# Patient Record
Sex: Male | Born: 1980
Health system: Southern US, Community
[De-identification: ages and names within clinical notes are randomized; demographics above are authoritative.]

## PROBLEM LIST (undated history)

## (undated) DIAGNOSIS — I8390 Asymptomatic varicose veins of unspecified lower extremity: Secondary | ICD-10-CM

## (undated) DIAGNOSIS — Z973 Presence of spectacles and contact lenses: Secondary | ICD-10-CM

## (undated) DIAGNOSIS — T7840XA Allergy, unspecified, initial encounter: Secondary | ICD-10-CM

## (undated) HISTORY — DX: Presence of spectacles and contact lenses: Z97.3

## (undated) HISTORY — DX: Asymptomatic varicose veins of unspecified lower extremity: I83.90

## (undated) HISTORY — DX: Allergy, unspecified, initial encounter: T78.40XA

## (undated) HISTORY — PX: ARTHROSCOPY KNEE W/ DRILLING: SUR92

---

## 2009-07-07 ENCOUNTER — Ambulatory Visit: Payer: Self-pay | Admitting: Family Medicine

## 2009-07-28 ENCOUNTER — Ambulatory Visit: Payer: Self-pay | Admitting: Family Medicine

## 2011-07-16 ENCOUNTER — Encounter: Payer: Self-pay | Admitting: Family Medicine

## 2011-07-17 ENCOUNTER — Encounter: Payer: Self-pay | Admitting: Medical

## 2011-07-17 ENCOUNTER — Ambulatory Visit (INDEPENDENT_AMBULATORY_CARE_PROVIDER_SITE_OTHER): Payer: BC Managed Care – PPO | Admitting: Medical

## 2011-07-17 VITALS — BP 120/80 | HR 72 | Temp 98.0°F | Resp 16 | Ht 70.5 in | Wt 212.0 lb

## 2011-07-17 DIAGNOSIS — D492 Neoplasm of unspecified behavior of bone, soft tissue, and skin: Secondary | ICD-10-CM

## 2011-07-17 DIAGNOSIS — Z0000 Encounter for general adult medical examination without abnormal findings: Secondary | ICD-10-CM

## 2011-07-17 DIAGNOSIS — R200 Anesthesia of skin: Secondary | ICD-10-CM | POA: Insufficient documentation

## 2011-07-17 DIAGNOSIS — B372 Candidiasis of skin and nail: Secondary | ICD-10-CM

## 2011-07-17 DIAGNOSIS — A6000 Herpesviral infection of urogenital system, unspecified: Secondary | ICD-10-CM | POA: Insufficient documentation

## 2011-07-17 DIAGNOSIS — R209 Unspecified disturbances of skin sensation: Secondary | ICD-10-CM

## 2011-07-17 LAB — POCT URINALYSIS DIPSTICK
Bilirubin, UA: NEGATIVE
Blood, UA: NEGATIVE
Glucose, UA: NEGATIVE
Ketones, UA: NEGATIVE
Leukocytes, UA: NEGATIVE
Nitrite, UA: NEGATIVE
Protein, UA: NEGATIVE
Spec Grav, UA: 1.02
Urobilinogen, UA: NEGATIVE
pH, UA: 5

## 2011-07-17 LAB — CBC WITH DIFFERENTIAL/PLATELET
Basophils Absolute: 0 10*3/uL (ref 0.0–0.1)
Basophils Relative: 1 % (ref 0–1)
Eosinophils Absolute: 0.1 10*3/uL (ref 0.0–0.7)
Eosinophils Relative: 3 % (ref 0–5)
HCT: 44.9 % (ref 39.0–52.0)
Hemoglobin: 15.6 g/dL (ref 13.0–17.0)
Lymphocytes Relative: 52 % — ABNORMAL HIGH (ref 12–46)
Lymphs Abs: 2.2 10*3/uL (ref 0.7–4.0)
MCH: 28.9 pg (ref 26.0–34.0)
MCHC: 34.7 g/dL (ref 30.0–36.0)
MCV: 83.3 fL (ref 78.0–100.0)
Monocytes Absolute: 0.3 10*3/uL (ref 0.1–1.0)
Monocytes Relative: 7 % (ref 3–12)
Neutro Abs: 1.5 10*3/uL — ABNORMAL LOW (ref 1.7–7.7)
Neutrophils Relative %: 37 % — ABNORMAL LOW (ref 43–77)
Platelets: 225 10*3/uL (ref 150–400)
RBC: 5.39 MIL/uL (ref 4.22–5.81)
RDW: 12.8 % (ref 11.5–15.5)
WBC: 4.2 10*3/uL (ref 4.0–10.5)

## 2011-07-17 LAB — COMPREHENSIVE METABOLIC PANEL WITH GFR
ALT: 26 U/L (ref 0–53)
AST: 16 U/L (ref 0–37)
Albumin: 5 g/dL (ref 3.5–5.2)
Alkaline Phosphatase: 57 U/L (ref 39–117)
BUN: 16 mg/dL (ref 6–23)
CO2: 29 meq/L (ref 19–32)
Calcium: 10 mg/dL (ref 8.4–10.5)
Chloride: 102 meq/L (ref 96–112)
Creat: 1.27 mg/dL (ref 0.50–1.35)
Glucose, Bld: 84 mg/dL (ref 70–99)
Potassium: 4.5 meq/L (ref 3.5–5.3)
Sodium: 139 meq/L (ref 135–145)
Total Bilirubin: 0.6 mg/dL (ref 0.3–1.2)
Total Protein: 7.9 g/dL (ref 6.0–8.3)

## 2011-07-17 LAB — LIPID PANEL
Cholesterol: 134 mg/dL (ref 0–200)
HDL: 52 mg/dL (ref >39)
LDL Cholesterol: 74 mg/dL (ref 0–99)
Total CHOL/HDL Ratio: 2.6 ratio
Triglycerides: 40 mg/dL (ref <150)
VLDL: 8 mg/dL (ref 0–40)

## 2011-07-17 LAB — TSH: TSH: 1.128 u[IU]/mL (ref 0.350–4.500)

## 2011-07-17 MED ORDER — VALACYCLOVIR HCL 1 G PO TABS: ORAL_TABLET | ORAL | Status: DC

## 2011-07-17 MED ORDER — FLUCONAZOLE 150 MG PO TABS: ORAL_TABLET | ORAL | Status: DC

## 2011-07-17 NOTE — Progress Notes (Signed)
Subjective:   HPI  Adam Flowers is a 30 y.o. male who presents for a complete physical.  Last visit here about a year ago for physical.  Sees eye doctor yearly, just saw dentist recently for routine care.  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  Thinks tetanus booster is up to date, and declines flu shot today.  He has been in usual state of health, but has a few concerns today.    He has hx/o genital herpes, has had 3 outbreaks this year.  Was last taking generic Valtrex for acute therapy only, but thinks it didn't work as good as name brand Valtrex.  Needs refill.  Diagnosed last year.  He got married recently and was wearing shoes that must have been too tight.  Since then, the left great toe has felt numb at the tip.  It hasn't really gotten back to normal.  He notes that wife gets yeast infections frequently, and she is under treatment for this currently.  He thinks she may have given him the same given some mild discomfort at head of penis, occasional discomfort with urinary.   He denies hx/o other STDs, and declines other STD screening today.    Reviewed their medical, surgical, family, social, medication, and allergy history and updated chart as appropriate.   Past Medical History  Diagnosis Date  . Allergy   . Wears glasses   . Genital herpes     Past Surgical History  Procedure Date  . Arthroscopy knee w/ drilling     Family History  Problem Relation Age of Onset  . Diabetes Father   . Cancer Neg Hx   . Heart disease Neg Hx   . Hypertension Neg Hx   . Hyperlipidemia Neg Hx   . Stroke Neg Hx     History   Social History  . Marital Status: Single    Spouse Name: N/A    Number of Children: N/A  . Years of Education: N/A   Occupational History  . [employer], Firefighter    Social History Main Topics  . Smoking status: Never Smoker   . Smokeless tobacco: Never Used    . Alcohol Use: 2.5 oz/week    5 drink(s) per week  . Drug Use: No  . Sexually Active: Not on file   Other Topics Concern  . Not on file   Social History Narrative   PhD student in business administration, recently married 9/12, 2.5 yo son, wife pregnant now 10/12, exercise 1-2 days/wk, 3hrs at a time with basketball    No current outpatient prescriptions on file prior to visit.    No Known Allergies  Review of Systems Constitutional: -fever, -chills, -sweats, -unexpected weight change, -anorexia, -fatigue Allergy: -sneezing, -itching, -congestion Dermatology: denies changing moles, rash, lumps, new worrisome lesions ENT: -runny nose, -ear pain, -sore throat, -hoarseness, -sinus pain, -teeth pain, -tinnitus, -hearing loss, -epistaxis Cardiology:  -chest pain, -palpitations, -edema, -orthopnea, -paroxysmal nocturnal dyspnea Respiratory: -cough, -shortness of breath, -dyspnea on exertion, -wheezing, -hemoptysis Gastroenterology: -abdominal pain, -nausea, -vomiting, -diarrhea, -constipation, -blood in stool, -changes in bowel movement, -dysphagia Hematology: -bleeding or bruising problems Musculoskeletal: -arthralgias, -myalgias, -joint swelling, -back pain, -neck pain, -cramping, -gait changes Ophthalmology: -vision changes, -eye redness, -itching, -discharge Urology: -dysuria, -difficulty urinating, -hematuria, -urinary frequency, -urgency, incontinence Neurology: -headache, -weakness, +tingling, -numbness, -speech abnormality, -memory loss, -falls, -dizziness Psychology:  -depressed mood, -agitation, -sleep problems  Objective:   Physical Exam  Filed Vitals:   07/17/11 0955  BP: 120/80  Pulse: 72  Temp: 98 F (36.7 C)  Resp: 16    General appearance: alert, no distress, WD/WN, black male Skin:right lateral lower leg distally with 1.7 cm round raised verrucal lesion, otherwise skin unremarkable, no other worrisome lesions HEENT: normocephalic, conjunctiva/corneas  normal, sclerae anicteric, PERRLA, EOMi, nares patent, no discharge or erythema, pharynx normal Oral cavity: MMM, tongue normal, teeth in good repair Neck: supple, no lymphadenopathy, no thyromegaly, no masses, normal ROM, no bruits Chest: non tender, normal shape and expansion Heart: RRR, normal S1, S2, no murmurs Lungs: CTA bilaterally, no wheezes, rhonchi, or rales Abdomen: +bs, soft, non tender, non distended, no masses, no hepatomegaly, no splenomegaly, no bruits Back: non tender, normal ROM, no scoliosis Musculoskeletal: upper extremities non tender, no obvious deformity, normal ROM throughout, lower extremities non tender, no obvious deformity, normal ROM throughout Extremities: no edema, no cyanosis, no clubbing Pulses: 2+ symmetric, upper and lower extremities, normal cap refill Neurological: alert, oriented x 3, CN2-12 intact, strength normal upper extremities and lower extremities, sensation normal throughout, DTRs 2+ throughout, no cerebellar signs, gait normal Psychiatric: normal affect, behavior normal, pleasant  GU: normal male external genitalia, nontender, no masses, no hernia, no lymphadenopathy Rectal: deferred.   Assessment and Plan :    Encounter Diagnoses  Name Primary?  . Routine general medical examination at a health care facility Yes  . Genital herpes   . Numbness of toes   . Neoplasm of skin of lower leg   . Skin yeast infection     Physical exam - discussed healthy lifestyle, diet, exercise, preventative care, vaccinations, and addressed their concerns.  He will check college records for last Tdap.  Declines flu shot.  Labs today, fasting.  Genital herpes - discussed acute vs suppressive therapy.  Refills today, discussed means of transmission.   If outbreaks c/t to occur frequently, may consider suppressive therapy.  Numbness of toes - likely related to the recent tight shoes.  Will give this a little more time to resolve. Labs today.  If not resolved in  another month, he will let me know.    Neoplasm - right ankle laterally site of prior trauma, but appears verrucal now.  He will either return for punch biopsy, or we can refer to dermatology for complete excision.    Will cover for yeast infection with Diflucan.  Call/return if not worsening.   Follow-up pending labs.

## 2011-07-17 NOTE — Patient Instructions (Signed)
Preventative Care for Adults, Male       REGULAR HEALTH EXAMS:  A routine yearly physical is a good way to check in with your primary care provider about your health and preventive screening. It is also an opportunity to share updates about your health and any concerns you have, and receive a thorough all-over exam.   Most health insurance companies pay for at least some preventative services.  Check with your health plan for specific coverages.  WHAT PREVENTATIVE SERVICES DO MEN NEED?  Adult men should have their weight and blood pressure checked regularly.   Men age 35 and older should have their cholesterol levels checked regularly.  Beginning at age 50 and continuing to age 75, men should be screened for colorectal cancer.  Certain people should may need continued testing until age 85.  Other cancer screening may include exams for testicular and prostate cancer.  Updating vaccinations is part of preventative care.  Vaccinations help protect against diseases such as the flu.  Lab tests are generally done as part of preventative care to screen for anemia and blood disorders, to screen for problems with the kidneys and liver, to screen for bladder problems, to check blood sugar, and to check your cholesterol level.  Preventative services generally include counseling about diet, exercise, avoiding tobacco, drugs, excessive alcohol consumption, and sexually transmitted infections.    GENERAL RECOMMENDATIONS FOR GOOD HEALTH:  Healthy diet:  Eat a variety of foods, including fruit, vegetables, animal or vegetable protein, such as meat, fish, chicken, and eggs, or beans, lentils, tofu, and grains, such as rice.  Drink plenty of water daily.  Decrease saturated fat in the diet, avoid lots of red meat, processed foods, sweets, fast foods, and fried foods.  Exercise:  Aerobic exercise helps maintain good heart health. At least 30-40 minutes of moderate-intensity exercise is recommended.  For example, a brisk walk that increases your heart rate and breathing. This should be done on most days of the week.   Find a type of exercise or a variety of exercises that you enjoy so that it becomes a part of your daily life.  Examples are running, walking, swimming, water aerobics, and biking.  For motivation and support, explore group exercise such as aerobic class, spin class, Zumba, Yoga,or  martial arts, etc.    Set exercise goals for yourself, such as a certain weight goal, walk or run in a race such as a 5k walk/run.  Speak to your primary care provider about exercise goals.  Disease prevention:  If you smoke or chew tobacco, find out from your caregiver how to quit. It can literally save your life, no matter how long you have been a tobacco user. If you do not use tobacco, never begin.   Maintain a healthy diet and normal weight. Increased weight leads to problems with blood pressure and diabetes.   The Body Mass Index or BMI is a way of measuring how much of your body is fat. Having a BMI above 27 increases the risk of heart disease, diabetes, hypertension, stroke and other problems related to obesity. Your caregiver can help determine your BMI and based on it develop an exercise and dietary program to help you achieve or maintain this important measurement at a healthful level.  High blood pressure causes heart and blood vessel problems.  Persistent high blood pressure should be treated with medicine if weight loss and exercise do not work.   Fat and cholesterol leaves deposits in your arteries   that can block them. This causes heart disease and vessel disease elsewhere in your body.  If your cholesterol is found to be high, or if you have heart disease or certain other medical conditions, then you may need to have your cholesterol monitored frequently and be treated with medication.   Ask if you should have a stress test if your history suggests this. A stress test is a test done on  a treadmill that looks for heart disease. This test can find disease prior to there being a problem.  Avoid drinking alcohol in excess (more than two drinks per day).  Avoid use of street drugs. Do not share needles with anyone. Ask for professional help if you need assistance or instructions on stopping the use of alcohol, cigarettes, and/or drugs.  Brush your teeth twice a day with fluoride toothpaste, and floss once a day. Good oral hygiene prevents tooth decay and gum disease. The problems can be painful, unattractive, and can cause other health problems. Visit your dentist for a routine oral and dental check up and preventive care every 6-12 months.   Look at your skin regularly.  Use a mirror to look at your back. Notify your caregivers of changes in moles, especially if there are changes in shapes, colors, a size larger than a pencil eraser, an irregular border, or development of new moles.  Safety:  Use seatbelts 100% of the time, whether driving or as a passenger.  Use safety devices such as hearing protection if you work in environments with loud noise or significant background noise.  Use safety glasses when doing any work that could send debris in to the eyes.  Use a helmet if you ride a bike or motorcycle.  Use appropriate safety gear for contact sports.  Talk to your caregiver about gun safety.  Use sunscreen with a SPF (or skin protection factor) of 15 or greater.  Lighter skinned people are at a greater risk of skin cancer. Don't forget to also wear sunglasses in order to protect your eyes from too much damaging sunlight. Damaging sunlight can accelerate cataract formation.   Practice safe sex. Use condoms. Condoms are used for birth control and to help reduce the spread of sexually transmitted infections (or STIs).  Some of the STIs are gonorrhea (the clap), chlamydia, syphilis, trichomonas, herpes, HPV (human papilloma virus) and HIV (human immunodeficiency virus) which causes AIDS.  The herpes, HIV and HPV are viral illnesses that have no cure. These can result in disability, cancer and death.   Keep carbon monoxide and smoke detectors in your home functioning at all times. Change the batteries every 6 months or use a model that plugs into the wall.   Vaccinations:  Stay up to date with your tetanus shots and other required immunizations. You should have a booster for tetanus every 10 years. Be sure to get your flu shot every year, since 5%-20% of the [place] population comes down with the flu. The flu vaccine changes each year, so being vaccinated once is not enough. Get your shot in the fall, before the flu season peaks.   Other vaccines to consider:  Pneumococcal vaccine to protect against certain types of pneumonia.  This is normally recommended for adults age 65 or older.  However, adults younger than 29 years old with certain underlying conditions such as diabetes, heart or lung disease should also receive the vaccine.  Shingles vaccine to protect against Varicella Zoster if you are older than age 60, or younger   than 29 years old with certain underlying illness.  Hepatitis A vaccine to protect against a form of infection of the liver by a virus acquired from food.  Hepatitis B vaccine to protect against a form of infection of the liver by a virus acquired from blood or body fluids, particularly if you work in health care.  If you plan to travel internationally, check with your local health department for specific vaccination recommendations.  Cancer Screening:  Most routine colon cancer screening begins at the age of 50. On a yearly basis, doctors may provide special easy to use take-home tests to check for hidden blood in the stool. Sigmoidoscopy or colonoscopy can detect the earliest forms of colon cancer and is life saving. These tests use a small camera at the end of a tube to directly examine the colon. Speak to your caregiver about this at age 50, when routine  screening begins (and is repeated every 5 years unless early forms of pre-cancerous polyps or small growths are found).   At the age of 50 men usually start screening for prostate cancer every year. Screening may begin at a younger age for those with higher risk. Those at higher risk include African-Americans or having a family history of prostate cancer. There are two types of tests for prostate cancer:   Prostate-specific antigen (PSA) testing. Recent studies raise questions about prostate cancer using PSA and you should discuss this with your caregiver.   Digital rectal exam (in which your doctor's lubricated and gloved finger feels for enlargement of the prostate through the anus).   Screening for testicular cancer.  Do a monthly exam of your testicles. Gently roll each testicle between your thumb and fingers, feeling for any abnormal lumps. The best time to do this is after a hot shower or bath when the tissues are looser. Notify your caregivers of any lumps, tenderness or changes in size or shape immediately.     

## 2011-11-27 ENCOUNTER — Encounter: Payer: Self-pay | Admitting: Medical

## 2011-11-27 ENCOUNTER — Ambulatory Visit (INDEPENDENT_AMBULATORY_CARE_PROVIDER_SITE_OTHER): Payer: BC Managed Care – PPO | Admitting: Medical

## 2011-11-27 DIAGNOSIS — A6000 Herpesviral infection of urogenital system, unspecified: Secondary | ICD-10-CM

## 2011-11-27 MED ORDER — VALACYCLOVIR HCL 1 G PO TABS: ORAL_TABLET | ORAL | Status: DC

## 2011-11-27 NOTE — Progress Notes (Signed)
Subjective:   HPI Adam Flowers is a 31 y.o. male who presents for recheck on genital herpes.  Since last visit he got married, and he had his second child last Monday.  He notes has a son Adam Flowers who will turn 3yo soon, and has new son Adam Flowers who is a week and a half old.  Due to all the recent stress, he has had about 6-8 genital herpes outbreak since last visit.  Wants to go on preventative therapy.  Otherwise doing fine, no c/o.  No other aggravating or relieving factors.  No other c/o.  The following portions of the patient's history were reviewed and updated as appropriate: allergies, current medications, past family history, past medical history, past social history, past surgical history and problem list.  Past Medical History  Diagnosis Date  . Allergy   . Wears glasses   . Genital herpes    Review of Systems Noted in HPI above     Objective:   Physical Exam  General appearance: alert, no distress, WD/WN GU: normal circumcised penis, no obvious erythema or lesions today, and he is asymptomatic today    Assessment and Plan:    Encounter Diagnosis  Name Primary?  . Genital herpes Yes   Switch to daily suppressive therapy.   Call if not helping to keep symptoms controled.  Advised he make a way to get exercise and reduce stress where possible.  He will get me copies of prior vaccines from his last college.

## 2012-01-23 ENCOUNTER — Ambulatory Visit: Payer: BC Managed Care – PPO | Admitting: Medical

## 2012-01-23 ENCOUNTER — Telehealth: Payer: Self-pay | Admitting: Family Medicine

## 2012-01-23 NOTE — Telephone Encounter (Signed)
PATIENT WAS NOITIFIED THAT HE NEEDS TO COME IN FOR A OV FOR HIS MEDICATIONS. PATIENT HAS SCHEDULED HIS APPOINTMENT FOR 01/23/12 @ 330 PM. CLS

## 2012-01-23 NOTE — Telephone Encounter (Signed)
Message copied by Janeice Robinson on Wed Jan 23, 2012 10:44 AM ------      Message from: Jac Canavan      Created: Tue Jan 22, 2012  8:43 PM       Call pt and ask him to come in regarding medication.  I spoke with his psychologist today.  He recommended he come and see me about medication.

## 2012-01-25 ENCOUNTER — Encounter: Payer: Self-pay | Admitting: Medical

## 2012-01-25 ENCOUNTER — Ambulatory Visit (INDEPENDENT_AMBULATORY_CARE_PROVIDER_SITE_OTHER): Payer: BC Managed Care – PPO | Admitting: Medical

## 2012-01-25 VITALS — BP 118/82 | HR 84 | Temp 98.2°F | Resp 16 | Wt 220.0 lb

## 2012-01-25 DIAGNOSIS — F430 Acute stress reaction: Secondary | ICD-10-CM

## 2012-01-25 DIAGNOSIS — F438 Other reactions to severe stress: Secondary | ICD-10-CM

## 2012-01-25 DIAGNOSIS — F410 Panic disorder [episodic paroxysmal anxiety] without agoraphobia: Secondary | ICD-10-CM

## 2012-01-25 MED ORDER — ALPRAZOLAM 0.5 MG PO TABS: ORAL_TABLET | ORAL | Status: DC

## 2012-01-25 NOTE — Progress Notes (Addendum)
Subjective: Here at my request.  He recently has been having some anxiety attacks and irritability.  He notes that lately has been having a lot of problems dealing with stress.  Mother-in-law recently diagnosed with cancer, he has 30yo son plus new baby 19mo old, just got married in 05/2011, is still working towards his PhD, does most of his classes online after children go to bed, and works full time as Firefighter at [employer].   Lately it has been harder to deal with the stress, and he has been more quick to set off and agitate.  One particularly day this week he got pulled twice in the same day by the police for inspection sticker out of date, and the same morning his supervisor was pointing out tardiness although he showed him the tickets.  He had to leave work.  Been out of work since Monday taking time off to deal with the stress.  He recently started seeing Psychologist Lonell Face to help deal with the stress.   Dr. Delton See called me here earlier in the week to discus his case and recommend short acting benzos to help him with acute agitation or panic attack.   He is considering taking short term disability to help deal with the stress and anxiety.  He thinks he needs a couple weeks out of work to get things straight mentally, emotionally, and physically.   He was having more frequent herpes outbreaks with the stress, so he takes Valtrex daily for suppression.   Past Medical History  Diagnosis Date  . Allergy   . Wears glasses   . Genital herpes    ROS: Psych: no suicidal ideation, HI, depression  Objective: Gen: wd, wn, nad, weight gain since last visit Psych: seems stressed, but pleasant  Assessment: Encounter Diagnoses  Name Primary?  . Acute stress reaction Yes  . Panic attack     Plan: Discussed his concerns.  Discussed ways to deal with stress and anxiety, getting exercise, working with wife to help with child and household responsibilities.  He will continue counseling  with Dr. Delton See, can use Xanax prn for acute panic or agitation.  Discussed proper use of the medication, risks/benefits.  We discussed other medication options as well.  He will need FMLA form complete as well, and this will come later by fax.  Recheck 36mo, sooner prn.  Spent 25 min face to face in discussion, eval and management.

## 2012-02-26 ENCOUNTER — Encounter: Payer: Self-pay | Admitting: Medical

## 2012-02-26 ENCOUNTER — Ambulatory Visit (INDEPENDENT_AMBULATORY_CARE_PROVIDER_SITE_OTHER): Payer: BC Managed Care – PPO | Admitting: Medical

## 2012-02-26 VITALS — BP 112/84 | HR 80 | Resp 16 | Wt 223.0 lb

## 2012-02-26 DIAGNOSIS — F430 Acute stress reaction: Secondary | ICD-10-CM

## 2012-02-26 DIAGNOSIS — J309 Allergic rhinitis, unspecified: Secondary | ICD-10-CM

## 2012-02-26 DIAGNOSIS — A6000 Herpesviral infection of urogenital system, unspecified: Secondary | ICD-10-CM

## 2012-02-26 DIAGNOSIS — F438 Other reactions to severe stress: Secondary | ICD-10-CM

## 2012-02-26 MED ORDER — MOMETASONE FUROATE 50 MCG/ACT NA SUSP: 2.0000 | Freq: Every day | NASAL | Status: DC

## 2012-02-26 NOTE — Progress Notes (Signed)
Subjective:   HPI  Adam Flowers is a 31 y.o. male who presents for recheck. I saw him recently for acute stress reaction.  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   He only used Xanax once since last visit.  He has been back to Dr. Delton See for counseling and has implemented the strategies that they discussed.   He is trying to find a small bit of personal time daily to regroup his thoughts and get some exercise.  He and wife have discussed his concerns and strategies going forward.  Overall doing better, has started back to work full time, cut down to 1 class online for now.    Having trouble with allergies, runny nose, sneezing, watery notes, itchy eyes.  Using nothing for symptoms.  No other aggravating or relieving factors.    No other c/o.  The following portions of the patient's history were reviewed and updated as appropriate: allergies, current medications, past family history, past medical history, past social history, past surgical history and problem list.  Past Medical History  Diagnosis Date  . Allergy   . Wears glasses   . Genital herpes     No Known Allergies   Review of Systems ROS reviewed and was negative other than noted in HPI or above.    Objective:   Physical Exam  General appearance: alert, no distress, WD/WN HEENT: normocephalic, sclerae anicteric, TMs pearly, nares with swollen turbinate, clear discharge, but no erythema, pharynx normal Oral cavity: MMM, no lesions Neck: supple, no lymphadenopathy, no thyromegaly, no masses Heart: RRR, normal S1, S2, no murmurs Lungs: CTA bilaterally, no wheezes, rhonchi, or rales Psych: pleasant, good eye contact   Assessment and Plan :     Encounter Diagnoses  Name Primary?  . Acute stress reaction Yes  . Allergic  rhinitis   . Herpes genitalia    Acute stress reaction - seems to be doing better, has goals, has coping strategies.  He had taken a short period of time off work to regroup his thoughts and plan.   Only used Xanax once.  He has been seeing counseling, and has started back to work full time.  Advised he return or call when needed or if things worsen again, but he seems to be on the right track now in regards to his stressors.   Allergic rhinitis - trial of Nasonex, avoid allergic triggers  Herpes genitalia - doing well on suppressive therapy

## 2012-06-03 ENCOUNTER — Other Ambulatory Visit (INDEPENDENT_AMBULATORY_CARE_PROVIDER_SITE_OTHER): Payer: BC Managed Care – PPO

## 2012-06-03 DIAGNOSIS — Z111 Encounter for screening for respiratory tuberculosis: Secondary | ICD-10-CM

## 2012-06-04 ENCOUNTER — Telehealth: Payer: Self-pay | Admitting: Medical

## 2012-06-04 NOTE — Telephone Encounter (Signed)
LM

## 2012-06-05 ENCOUNTER — Institutional Professional Consult (permissible substitution): Payer: BC Managed Care – PPO | Admitting: Family Medicine

## 2012-06-11 ENCOUNTER — Other Ambulatory Visit (INDEPENDENT_AMBULATORY_CARE_PROVIDER_SITE_OTHER): Payer: BC Managed Care – PPO

## 2012-06-11 ENCOUNTER — Telehealth: Payer: Self-pay | Admitting: Family Medicine

## 2012-06-11 DIAGNOSIS — Z23 Encounter for immunization: Secondary | ICD-10-CM

## 2012-06-11 LAB — TB SKIN TEST: TB Skin Test: NEGATIVE

## 2012-06-11 NOTE — Telephone Encounter (Signed)
PT INFORMED, HE WILL COME IN TODAY FOR TD

## 2013-01-11 ENCOUNTER — Emergency Department (HOSPITAL_COMMUNITY)
Admission: EM | Admit: 2013-01-11 | Discharge: 2013-01-11 | Disposition: A | Discharge status: Home / Self Care | Payer: BC Managed Care – PPO | Attending: Emergency Medicine | Admitting: Emergency Medicine

## 2013-01-11 ENCOUNTER — Emergency Department (INDEPENDENT_AMBULATORY_CARE_PROVIDER_SITE_OTHER): Payer: BC Managed Care – PPO

## 2013-01-11 ENCOUNTER — Encounter (HOSPITAL_COMMUNITY): Payer: Self-pay | Admitting: *Deleted

## 2013-01-11 ENCOUNTER — Encounter (HOSPITAL_COMMUNITY): Payer: Self-pay | Admitting: Nurse Practitioner

## 2013-01-11 ENCOUNTER — Emergency Department (HOSPITAL_COMMUNITY)
Admission: EM | Admit: 2013-01-11 | Discharge: 2013-01-11 | Disposition: A | Discharge status: Nursing Home (Includes ICF) | Payer: BC Managed Care – PPO | Source: Home / Self Care

## 2013-01-11 DIAGNOSIS — Z8619 Personal history of other infectious and parasitic diseases: Secondary | ICD-10-CM | POA: Insufficient documentation

## 2013-01-11 DIAGNOSIS — Y929 Unspecified place or not applicable: Secondary | ICD-10-CM | POA: Insufficient documentation

## 2013-01-11 DIAGNOSIS — T17208A Unspecified foreign body in pharynx causing other injury, initial encounter: Secondary | ICD-10-CM

## 2013-01-11 DIAGNOSIS — IMO0002 Reserved for concepts with insufficient information to code with codable children: Secondary | ICD-10-CM | POA: Insufficient documentation

## 2013-01-11 DIAGNOSIS — Y9389 Activity, other specified: Secondary | ICD-10-CM | POA: Insufficient documentation

## 2013-01-11 DIAGNOSIS — Z79899 Other long term (current) drug therapy: Secondary | ICD-10-CM | POA: Insufficient documentation

## 2013-01-11 NOTE — ED Provider Notes (Signed)
Medical screening examination/treatment/procedure(s) were performed by resident physician or non-physician practitioner and as supervising physician I was immediately available for consultation/collaboration.   Barkley Bruns MD.   Linna Hoff, MD 01/11/13 [phone]

## 2013-01-11 NOTE — ED Notes (Signed)
MD at bedside.-Dr. Knapp 

## 2013-01-11 NOTE — ED Provider Notes (Signed)
History     CSN: [id number]  Arrival date & time 01/11/13  1124   None     Chief Complaint  Patient presents with  . Foreign Body    (Consider location/radiation/quality/duration/timing/severity/associated sxs/prior treatment) HPI Comments: 32 year old male was eating steak and eggs this morning after which he experienced a foreign body sensation in the posterior most aspect of the tongue, but is able to breathe and swallow without difficulty. No regurgitation or vomiting.   Past Medical History  Diagnosis Date  . Allergy   . Wears glasses   . Genital herpes     Past Surgical History  Procedure Laterality Date  . Arthroscopy knee w/ drilling      Family History  Problem Relation Age of Onset  . Diabetes Father   . Cancer Neg Hx   . Heart disease Neg Hx   . Hypertension Neg Hx   . Hyperlipidemia Neg Hx   . Stroke Neg Hx     History  Substance Use Topics  . Smoking status: Never Smoker   . Smokeless tobacco: Never Used  . Alcohol Use: 2.5 oz/week    5 drink(s) per week      Review of Systems  Constitutional: Negative.   HENT: Negative for sore throat, rhinorrhea, trouble swallowing, voice change and postnasal drip.   Respiratory: Negative.   Gastrointestinal: Negative.   Neurological: Negative.     Allergies  Review of patient's allergies indicates no known allergies.  Home Medications   Current Outpatient Rx  Name  Route  Sig  Dispense  Refill  . ALPRAZolam (XANAX) 0.5 MG tablet      As needed for anxiety, up to twice daily   30 tablet   0   . mometasone (NASONEX) 50 MCG/ACT nasal spray   Nasal   Place 2 sprays into the nose daily.   17 g   0   . valACYclovir (VALTREX) 1000 MG tablet      1 tablet po BID x 1 week for flare up   30 tablet   3     BP 128/76  Pulse 66  Resp 18  SpO2 98%  Physical Exam  Nursing note and vitals reviewed. Constitutional: He is oriented to person, place, and time. He appears well-developed and  well-nourished. No distress.  HENT:  Mouth/Throat: Oropharynx is clear and moist. No oropharyngeal exudate.  The oropharynx and most of the surface area the tongue is visible without observation of a foreign body or injury. Am unable to visualize further inferiorly.  Neck: Normal range of motion. Neck supple.  Cardiovascular: Normal rate.   Pulmonary/Chest: Effort normal.  Lymphadenopathy:    He has no cervical adenopathy.  Neurological: He is alert and oriented to person, place, and time.  Skin: Skin is warm and dry.  Psychiatric: He has a normal mood and affect.    ED Course  Procedures (including critical care time)  Labs Reviewed - No data to display Dg Neck Soft Tissue  01/11/2013  *RADIOLOGY REPORT*  Clinical Data: Sensation of foreign body posterior to the tongue.  NECK SOFT TISSUES - 1+ VIEW  Comparison: No priors.  Findings: Two soft tissue lateral views of the neck demonstrate a subtle curvilinear density along the anterior aspect of the hypopharynx which is of uncertain etiology and significance.  This could represent a normal structure (i.e., inferior aspect of the stylohyoid ligament), or could represent a small retained radiopaque foreign body such as a fish bone.  Clinical correlation  is recommended.  No other retained radiopaque foreign body is identified.  The epiglottis and subglottic airway are normal in appearance.  IMPRESSION: 1.  Potential thin curvilinear radiopaque foreign body in the hypopharynx, as above.  Whether not this represents a retained foreign body such as a fish bone, or a normal anatomic structure is uncertain.  However, this may warrant further clinical evaluation.   Original Report Authenticated By: Trudie Reed, M.D.      1. Foreign body of pharynx, initial encounter       MDM  Send to the ED for evaluation of possible FB in hypopharynx.        Hayden Rasmussen, NP 01/11/13 1415

## 2013-01-11 NOTE — ED Notes (Signed)
Patient states pain in back of tongue after eating this morning. When moving tongue feels pain scraping on back throat from tongue.

## 2013-01-11 NOTE — ED Notes (Signed)
Pt states understanding of discharge instructions 

## 2013-01-11 NOTE — ED Notes (Signed)
ENT Consult at bedside

## 2013-01-11 NOTE — ED Provider Notes (Signed)
History    CSN: [id number] Arrival date & time 01/11/13  1430 First MD Initiated Contact with Patient 01/11/13 1752      Chief Complaint  Patient presents with  . Sore Throat    HPI Pt was eating steak and eggs this am that he cooked on the grill.  Since then he has felt a foreing body sensation in the back of his throat. Patient has tried gargling. He's also tried brushing his teeth.  He symptoms unfortunately have persisted. Patient denies any difficulty swallowing. Is not any difficulty breathing. He feels like the foreign body is around the larynx on the right side. Patient went to an urgent care today where they performed plain films of the neck. The x-ray suggested the possibility of a foreign body. Patient was sent to the emergency room for further evaluation   Past Medical History  Diagnosis Date  . Allergy   . Wears glasses   . Genital herpes     Past Surgical History  Procedure Laterality Date  . Arthroscopy knee w/ drilling      Family History  Problem Relation Age of Onset  . Diabetes Father   . Cancer Neg Hx   . Heart disease Neg Hx   . Hypertension Neg Hx   . Hyperlipidemia Neg Hx   . Stroke Neg Hx     History  Substance Use Topics  . Smoking status: Never Smoker   . Smokeless tobacco: Never Used  . Alcohol Use: 2.5 oz/week    5 drink(s) per week      Review of Systems  All other systems reviewed and are negative.    Allergies  Review of patient's allergies indicates no known allergies.  Home Medications   Current Outpatient Rx  Name  Route  Sig  Dispense  Refill  . valACYclovir (VALTREX) 1000 MG tablet      1 tablet po BID x 1 week for flare up   30 tablet   3     BP 122/80  Pulse 70  Temp(Src) 98.9 F (37.2 C) (Oral)  Resp 18  SpO2 100%  Physical Exam  Nursing note and vitals reviewed. Constitutional: He appears well-developed and well-nourished. No distress.  HENT:  Head: Normocephalic and atraumatic.  Right Ear: External  ear normal.  Left Ear: External ear normal.  Eyes: Conjunctivae are normal. Right eye exhibits no discharge. Left eye exhibits no discharge. No scleral icterus.  Neck: Neck supple. No tracheal deviation present.  Cardiovascular: Normal rate, regular rhythm and intact distal pulses.   Pulmonary/Chest: Effort normal and breath sounds normal. No stridor. No respiratory distress. He has no wheezes. He has no rales.  Abdominal: Soft. Bowel sounds are normal. He exhibits no distension. There is no tenderness. There is no rebound and no guarding.  Musculoskeletal: He exhibits no edema and no tenderness.  Neurological: He is alert. He has normal strength. No sensory deficit. Cranial nerve deficit:  no gross defecits noted. He exhibits normal muscle tone. He displays no seizure activity. Coordination normal.  Skin: Skin is warm and dry. No rash noted.  Psychiatric: He has a normal mood and affect.    ED Course  Procedures (including critical care time)  Labs Reviewed - No data to display Dg Neck Soft Tissue  01/11/2013  *RADIOLOGY REPORT*  Clinical Data: Sensation of foreign body posterior to the tongue.  NECK SOFT TISSUES - 1+ VIEW  Comparison: No priors.  Findings: Two soft tissue lateral views of the neck  demonstrate a subtle curvilinear density along the anterior aspect of the hypopharynx which is of uncertain etiology and significance.  This could represent a normal structure (i.e., inferior aspect of the stylohyoid ligament), or could represent a small retained radiopaque foreign body such as a fish bone.  Clinical correlation is recommended.  No other retained radiopaque foreign body is identified.  The epiglottis and subglottic airway are normal in appearance.  IMPRESSION: 1.  Potential thin curvilinear radiopaque foreign body in the hypopharynx, as above.  Whether not this represents a retained foreign body such as a fish bone, or a normal anatomic structure is uncertain.  However, this may warrant  further clinical evaluation.   Original Report Authenticated By: Trudie Reed, M.D.      MDM  I consulted with Dr. Jearld Fenton, ENT considering the possible foreign body in his posterior pharynx.  Dr. Jearld Fenton will come to emergency room and plans on doing otolaryngogoscopy.   Dr Marjie Skiff evaluated the patient in the ED.  A small metallic foreign body (bristle from wire grill brush) was removed.       Celene Kras, MD 01/11/13 [phone]

## 2013-01-11 NOTE — ED Notes (Signed)
Pt reports he ate steak and eggs this am off grill and kept feeling something in his throat, [facility] sent for further eval of foreign body in throat on xray there. Denies pain. resp e/u

## 2013-01-11 NOTE — Consult Note (Signed)
Reason for Consult: Foreign body in the pharynx Referring Physician: Emergency room  Adam Flowers is an 32 y.o. male.  HPI: Patient ate at 10:30 today and had state. It was off the grill. He immediately felt a discomfort in the right side of his throat. It has been giving him trouble ever since. He has not tried E. being since then. It feels like something is sticking him on the right side. He is still able to drink fluids. He feels like this possibly is a piece of wire from the cleaning brush used for the grill. He had an x-ray which apparently did show the possibility of a foreign body in the pharynx.  Past Medical History  Diagnosis Date  . Allergy   . Wears glasses   . Genital herpes     Past Surgical History  Procedure Laterality Date  . Arthroscopy knee w/ drilling      Family History  Problem Relation Age of Onset  . Diabetes Father   . Cancer Neg Hx   . Heart disease Neg Hx   . Hypertension Neg Hx   . Hyperlipidemia Neg Hx   . Stroke Neg Hx     Social History:  reports that he has never smoked. He has never used smokeless tobacco. He reports that he drinks about 2.5 ounces of alcohol per week. He reports that he does not use illicit drugs.  Allergies: No Known Allergies  Medications: I have reviewed the patient's current medications.  No results found for this or any previous visit (from the past 48 hour(s)).  Dg Neck Soft Tissue  01/11/2013  *RADIOLOGY REPORT*  Clinical Data: Sensation of foreign body posterior to the tongue.  NECK SOFT TISSUES - 1+ VIEW  Comparison: No priors.  Findings: Two soft tissue lateral views of the neck demonstrate a subtle curvilinear density along the anterior aspect of the hypopharynx which is of uncertain etiology and significance.  This could represent a normal structure (i.e., inferior aspect of the stylohyoid ligament), or could represent a small retained radiopaque foreign body such as a fish bone.  Clinical correlation is  recommended.  No other retained radiopaque foreign body is identified.  The epiglottis and subglottic airway are normal in appearance.  IMPRESSION: 1.  Potential thin curvilinear radiopaque foreign body in the hypopharynx, as above.  Whether not this represents a retained foreign body such as a fish bone, or a normal anatomic structure is uncertain.  However, this may warrant further clinical evaluation.   Original Report Authenticated By: Trudie Reed, M.D.     ROS Blood pressure 122/80, pulse 70, temperature 98.9 F (37.2 C), temperature source Oral, resp. rate 18, SpO2 100.00%. Physical Exam  Constitutional: He appears well-developed and well-nourished.  HENT:  Nose: Nose normal.  He is awake and alert. He complains about pain in the right jugular digastric region. Oral cavity/oropharynx-exam reveals a wire sticking out of his right tonsil. No other lesions or swelling. He was sprayed with Cetacaine. An alligator forcep was used to grasp the wire easily and removed it from the right tonsil without difficulty. This immediately resolved his discomfort. Neck is without adenopathy.  Eyes: Pupils are equal, round, and reactive to light.  Neck: Normal range of motion. Neck supple.  Cardiovascular: Normal rate.   Respiratory: Effort normal.    Assessment/Plan: Right foreign body of tonsil-this was removed with direct visualization with an alligator forcep without difficulty. His symptoms were immediately resolved. He will followup if he has any further  issues or problems including bleeding or increasing pain. He should be for the most part back to normal by 24-48 hours.  Suzanna Obey 01/11/2013, 7:11 PM

## 2013-04-06 ENCOUNTER — Other Ambulatory Visit: Payer: Self-pay | Admitting: Medical

## 2013-04-06 NOTE — Telephone Encounter (Signed)
Is this okay to refill? Its for genital herpers.

## 2013-07-28 ENCOUNTER — Ambulatory Visit (INDEPENDENT_AMBULATORY_CARE_PROVIDER_SITE_OTHER): Payer: BC Managed Care – PPO | Admitting: Medical

## 2013-07-28 ENCOUNTER — Encounter: Payer: Self-pay | Admitting: Medical

## 2013-07-28 VITALS — BP 110/70 | HR 76 | Temp 98.1°F | Resp 16 | Wt 226.0 lb

## 2013-07-28 DIAGNOSIS — M659 Synovitis and tenosynovitis, unspecified: Secondary | ICD-10-CM

## 2013-07-28 DIAGNOSIS — M65839 Other synovitis and tenosynovitis, unspecified forearm: Secondary | ICD-10-CM

## 2013-07-28 DIAGNOSIS — M25539 Pain in unspecified wrist: Secondary | ICD-10-CM

## 2013-07-28 DIAGNOSIS — A6000 Herpesviral infection of urogenital system, unspecified: Secondary | ICD-10-CM

## 2013-07-28 DIAGNOSIS — M25532 Pain in left wrist: Secondary | ICD-10-CM

## 2013-07-28 MED ORDER — NAPROXEN 375 MG PO TABS: 375.0000 mg | ORAL_TABLET | Freq: Two times a day (BID) | ORAL | Status: DC

## 2013-07-28 MED ORDER — VALACYCLOVIR HCL 1 G PO TABS: ORAL_TABLET | ORAL | Status: DC

## 2013-07-28 NOTE — Progress Notes (Signed)
Subjective: Here for left wrist pain x 1+ months.   Was at a bachelor party playing football a month ago and fell on outstretched hand.  Didn't noticed much pain at first ,but on plane ride home few days later, started having pain on lateral wrist.  Pain was worse a few days, but has continued intermittently.   Since it hasn't improved wanted this checked out.   He also coaches and plays basketball.  Basketball season just started a few weeks ago and this aggravated the pain.   Has pain with wrist motion, playing ball, doing push ups.   Needs refill on valtex.   Hasn't had many flare ups but wants the medication on board for as needed for outbreak.    Objective: Gen: wd, wn, nad Skin: unremarkable MSK: +left finkelstein test, tender over lateral wrist over tendons but not snuff box tenderness, subtle swelling of left distal forearm, pain with wrist ROM at lateral wrist, otherwise nontender, no deformity, rest of UE unremarkable Neuro: normal strength, sensation of hands/wrists Pulses: normal wrist pulses normal cap refill   Assessment: Encounter Diagnoses  Name Primary?  . Tenosynovitis of hand or wrist Yes  . Wrist pain, acute, left   . Genital herpes    Plan: Tenosynovitis, wrist pain - begin NSAID, ice, relative rest, thumb spica splint, and if not back to normal in 2wk, recheck including xray.  Genital herpes - refilled Valtrex, rarely has outbreak

## 2013-07-28 NOTE — Patient Instructions (Signed)
Wrist tenosynovitis.  For 1-2 weeks, rest the hand, don't reinjure the hand.  Use the thumb spica splint daily for 1-2 weeks.     Begin Naprosyn twice daily for 1-2 weeks.  Ice the wrist 1-2 times daily  If not much improved 2 weeks, then recheck.

## 2013-09-21 ENCOUNTER — Ambulatory Visit: Payer: BC Managed Care – PPO | Admitting: Medical

## 2014-08-16 ENCOUNTER — Encounter: Payer: Self-pay | Admitting: Medical

## 2014-08-16 ENCOUNTER — Ambulatory Visit (INDEPENDENT_AMBULATORY_CARE_PROVIDER_SITE_OTHER): Payer: BC Managed Care – PPO | Admitting: Medical

## 2014-08-16 VITALS — BP 100/80 | HR 66 | Temp 98.0°F | Resp 16 | Wt 227.0 lb

## 2014-08-16 DIAGNOSIS — M25561 Pain in right knee: Secondary | ICD-10-CM

## 2014-08-16 MED ORDER — HYDROCODONE-ACETAMINOPHEN 7.5-325 MG PO TABS: 1.0000 | ORAL_TABLET | Freq: Four times a day (QID) | ORAL | Status: DC | PRN

## 2014-08-16 NOTE — Progress Notes (Signed)
Subjective: Here for right knee pain. Denies history of right knee problems or surgery. 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  Saturday, November 28 he was playing basketball, had been playing about an hour and a half and at one point when he went up for a jump he felt sudden pain in the right knee.  He immediately stopped playing basketball. Since then he has been taking ibuprofen 600 mg once daily, using ice and elevation.  He denies hearing a pop. He is able to bear weight. He is here today for ongoing pain and swelling of the right knee compared to the left. No leg numbness, tingling, weakness, no hip pain and ankle pain no other aggravating or relieving factors. No other complaint.  Review of systems as in subjective  Objective: Gen.: Well-developed, well-nourished, no acute distress Skin: No erythema or ecchymosis, no warmth of either knee Lower extremity pulses normal, neuro exam normal lower extremities Musculoskeletal: Right knee with mild generalized swelling, tender over the patella, pain with flexion of the knee which is limited due to pain and swelling, tenderness with McMurray test but unable to fully examine special tests given pain and swelling. No joint line tenderness, no obvious laxity of MCL, anterior cruciate ligament, PCL, LCL. Negative Lachman's. Nontender with extension. Rest of right and left leg exam unremarkable Extremities no lower extremity edema   Assessment: Encounter Diagnosis  Name Primary?  . Right knee pain Yes   Plan: Discussed his symptoms, exam, continue ice, rest, knee elevation, ibuprofen 600 mg over-the-counter 3 times a day, prescription for hydrocodone as needed for worse pain. Note given for work to limit activity that would aggravate the knee.  Recheck in one week given the inability to fully examine the knee given the pain and swelling.  If no improvement in a week or worse in  the meantime we'll need to get an x-ray.

## 2015-11-23 ENCOUNTER — Encounter: Payer: Self-pay | Admitting: Medical

## 2015-11-23 ENCOUNTER — Ambulatory Visit (INDEPENDENT_AMBULATORY_CARE_PROVIDER_SITE_OTHER): Payer: BC Managed Care – PPO | Admitting: Medical

## 2015-11-23 VITALS — BP 116/80 | HR 64 | Ht 69.25 in | Wt 228.0 lb

## 2015-11-23 DIAGNOSIS — E669 Obesity, unspecified: Secondary | ICD-10-CM | POA: Diagnosis not present

## 2015-11-23 DIAGNOSIS — I8311 Varicose veins of right lower extremity with inflammation: Secondary | ICD-10-CM | POA: Diagnosis not present

## 2015-11-23 DIAGNOSIS — I83019 Varicose veins of right lower extremity with ulcer of unspecified site: Secondary | ICD-10-CM | POA: Diagnosis not present

## 2015-11-23 DIAGNOSIS — Z0000 Encounter for general adult medical examination without abnormal findings: Secondary | ICD-10-CM | POA: Diagnosis not present

## 2015-11-23 DIAGNOSIS — L97909 Non-pressure chronic ulcer of unspecified part of unspecified lower leg with unspecified severity: Secondary | ICD-10-CM

## 2015-11-23 DIAGNOSIS — Z13220 Encounter for screening for lipoid disorders: Secondary | ICD-10-CM | POA: Insufficient documentation

## 2015-11-23 DIAGNOSIS — I872 Venous insufficiency (chronic) (peripheral): Secondary | ICD-10-CM

## 2015-11-23 DIAGNOSIS — L97919 Non-pressure chronic ulcer of unspecified part of right lower leg with unspecified severity: Secondary | ICD-10-CM

## 2015-11-23 DIAGNOSIS — I83009 Varicose veins of unspecified lower extremity with ulcer of unspecified site: Secondary | ICD-10-CM | POA: Insufficient documentation

## 2015-11-23 NOTE — Progress Notes (Signed)
Subjective:   HPI  Adam Flowers is a 35 y.o. male who presents for a physical, surgery clearance  Medical care team includes:  Dentist  Eye doctor  [facility], just [facility] [place], DAVID Catharine, PA-C here for primary care  Concerns: Here for varicose veins.   Saw vein clinic recently, pursuing corrective surgery. Has had problems with varicose veins since high school.  Since high school right leg has been bigger than left, gets some tenderness of right leg.  Has skin lesion that won't heal on right lower leg that occurred from football cleat years ago, but since it never healed, he started looking into causes.  Wife encouraged him to see vein clinic.    Reviewed their medical, surgical, family, social, medication, and allergy history and updated chart as appropriate.  Past Medical History  Diagnosis Date  . Allergy   . Wears glasses   . Genital herpes   . Varicose vein     Past Surgical History  Procedure Laterality Date  . Arthroscopy knee w/ drilling      Social History   Social History  . Marital Status: Single    Spouse Name: N/A  . Number of Children: N/A  . Years of Education: N/A   Occupational History  . [employer], Music therapist    Social History Main Topics  . Smoking status: Never Smoker   . Smokeless tobacco: Never Used  . Alcohol Use: 2.5 oz/week    5 Standard drinks or equivalent per week     Comment: once monthly, minimal  . Drug Use: No  . Sexual Activity: Not on file   Other Topics Concern  . Not on file   Social History Narrative   PhD business administration,  Educational psychologist at [employer], married 9/12, has 34yo, 34yo, 34yo.   exercise 1-2 days/wk, as of 11/2015    Family History  Problem Relation Age of Onset  . Diabetes Father   . Cancer Neg Hx   . Heart disease Neg Hx   . Hypertension Neg Hx   . Hyperlipidemia Neg Hx   . Stroke Neg Hx      Current outpatient prescriptions:  .  HYDROcodone-acetaminophen  (NORCO) 7.5-325 MG per tablet, Take 1 tablet by mouth every 6 (six) hours as needed for moderate pain. (Patient not taking: Reported on 11/23/2015), Disp: 20 tablet, Rfl: 0 .  valACYclovir (VALTREX) 1000 MG tablet, 1 tablet po BID x 1 week for flare up (Patient not taking: Reported on 11/23/2015), Disp: 30 tablet, Rfl: 3  No Known Allergies     Review of Systems Constitutional: -fever, -chills, -sweats, -unexpected weight change, -decreased appetite, -fatigue Allergy: -sneezing, -itching, -congestion Dermatology: -changing moles, --rash, -lumps ENT: -runny nose, -ear pain, -sore throat, -hoarseness, -sinus pain, -teeth pain, - ringing in ears, -hearing loss, -nosebleeds Cardiology: -chest pain, -palpitations, -swelling, -difficulty breathing when lying flat, -waking up short of breath Respiratory: -cough, -shortness of breath, -difficulty breathing with exercise or exertion, -wheezing, -coughing up blood Gastroenterology: -abdominal pain, -nausea, -vomiting, -diarrhea, -constipation, -blood in stool, -changes in bowel movement, -difficulty swallowing or eating Hematology: -bleeding, -bruising  Musculoskeletal: -joint aches, -muscle aches, -joint swelling, -back pain, -neck pain, -cramping, -changes in gait Ophthalmology: denies vision changes, eye redness, itching, discharge Urology: -burning with urination, -difficulty urinating, -blood in urine, -urinary frequency, -urgency, -incontinence Neurology: -headache, -weakness, -tingling, -numbness, -memory loss, -falls, -dizziness Psychology: -depressed mood, -agitation, -sleep problems     Objective:   Physical Exam  BP  116/80 mmHg  Pulse 64  Ht 5' 9.25" (1.759 m)  Wt 228 lb (103.42 kg)  BMI 33.43 kg/m2  General appearance: alert, no distress, WD/WN, AA male Skin:scattered macules, no worrisome lesions HEENT: normocephalic, conjunctiva/corneas normal, sclerae anicteric, PERRLA, EOMi, nares patent, no discharge or erythema, pharynx  normal Oral cavity: MMM, tongue normal, teeth in good repair Neck: supple, no lymphadenopathy, no thyromegaly, no masses, normal ROM, no bruits Chest: non tender, normal shape and expansion Heart: RRR, normal S1, S2, no murmurs Lungs: CTA bilaterally, no wheezes, rhonchi, or rales Abdomen: +bs, soft, non tender, non distended, no masses, no hepatomegaly, no splenomegaly, no bruits Back: non tender, normal ROM, no scoliosis Musculoskeletal: upper extremities non tender, no obvious deformity, normal ROM throughout, lower extremities non tender, no obvious deformity, normal ROM throughout Extremities: right lower leg larger diameter than left chronic per patient, moderate varicosities throughout right lower leg, right lower anterolateral leg with brownish coloration c/w venous stasis disease, 2cm round area of nonhealing tissue vs ulcer, no cyanosis, no clubbing Pulses: 2+ symmetric, upper and lower extremities, normal cap refill Neurological: alert, oriented x 3, CN2-12 intact, strength normal upper extremities and lower extremities, sensation normal throughout, DTRs 2+ throughout, no cerebellar signs, gait normal Psychiatric: normal affect, behavior normal, pleasant  GU: normal male external genitalia, nontender, no masses, no hernia, no lymphadenopathy Rectal: deferred   Assessment and Plan :    Encounter Diagnoses  Name Primary?  . Encounter for health maintenance examination in adult Yes  . Varicose veins of lower extremities with ulcer, right ([place])   . Chronic venous insufficiency   . Venous stasis dermatitis of right lower extremity   . Obesity     Physical exam - discussed healthy lifestyle, diet, exercise, preventative care, vaccinations, and addressed their concerns.   See your eye doctor yearly for routine vision care. See your dentist yearly for routine dental care including hygiene visits twice yearly. varicose veins, venous insufficiency, stasis dermatitis - c/t Ted hose,  exercise and f/u with vein clinic as planned. Advised weight loss, healthy diet, routine exercise Follow-up pending labs

## 2015-11-24 LAB — COMPREHENSIVE METABOLIC PANEL
ALT: 29 U/L (ref 9–46)
AST: 16 U/L (ref 10–40)
Albumin: 4.8 g/dL (ref 3.6–5.1)
Alkaline Phosphatase: 55 U/L (ref 40–115)
BUN: 14 mg/dL (ref 7–25)
CO2: 25 mmol/L (ref 20–31)
Calcium: 9.7 mg/dL (ref 8.6–10.3)
Chloride: 103 mmol/L (ref 98–110)
Creat: 1.27 mg/dL (ref 0.60–1.35)
Glucose, Bld: 78 mg/dL (ref 65–99)
Potassium: 4.2 mmol/L (ref 3.5–5.3)
Sodium: 137 mmol/L (ref 135–146)
Total Bilirubin: 0.6 mg/dL (ref 0.2–1.2)
Total Protein: 8.2 g/dL — ABNORMAL HIGH (ref 6.1–8.1)

## 2015-11-24 LAB — CBC
HCT: 44 % (ref 39.0–52.0)
Hemoglobin: 15.1 g/dL (ref 13.0–17.0)
MCH: 28.1 pg (ref 26.0–34.0)
MCHC: 34.3 g/dL (ref 30.0–36.0)
MCV: 81.8 fL (ref 78.0–100.0)
MPV: 10.7 fL (ref 8.6–12.4)
Platelets: 241 10*3/uL (ref 150–400)
RBC: 5.38 MIL/uL (ref 4.22–5.81)
RDW: 14.7 % (ref 11.5–15.5)
WBC: 5 10*3/uL (ref 4.0–10.5)

## 2015-11-24 LAB — HEMOGLOBIN A1C
Hgb A1c MFr Bld: 5.8 % — ABNORMAL HIGH (ref <5.7)
Mean Plasma Glucose: 120 mg/dL — ABNORMAL HIGH (ref <117)

## 2015-11-24 LAB — LIPID PANEL
Cholesterol: 146 mg/dL (ref 125–200)
HDL: 56 mg/dL (ref >=40)
LDL Cholesterol: 74 mg/dL (ref <130)
Total CHOL/HDL Ratio: 2.6 Ratio (ref <=5.0)
Triglycerides: 81 mg/dL (ref <150)
VLDL: 16 mg/dL (ref <30)

## 2017-05-27 ENCOUNTER — Ambulatory Visit: Payer: BC Managed Care – PPO | Admitting: Medical

## 2017-06-07 ENCOUNTER — Ambulatory Visit (INDEPENDENT_AMBULATORY_CARE_PROVIDER_SITE_OTHER): Payer: BC Managed Care – PPO | Admitting: Medical

## 2017-06-07 ENCOUNTER — Encounter: Payer: Self-pay | Admitting: Medical

## 2017-06-07 VITALS — BP 130/82 | HR 63 | Wt 239.2 lb

## 2017-06-07 DIAGNOSIS — H029 Unspecified disorder of eyelid: Secondary | ICD-10-CM

## 2017-06-07 DIAGNOSIS — Z13220 Encounter for screening for lipoid disorders: Secondary | ICD-10-CM

## 2017-06-07 NOTE — Progress Notes (Signed)
Subjective: Chief Complaint  Patient presents with  . bump on eye lid    bump on eye left lid x 5 months   Here for bump on left eyelid.   Been there several months.   He tried warm compresses but that hasn't helped.  Not painful, no vision changes, no trauma to eye.   No other aggravating or relieving factors. No other complaint.  Past Medical History:  Diagnosis Date  . Allergy   . Genital herpes   . Varicose vein   . Wears glasses    No current outpatient prescriptions on file prior to visit.   No current facility-administered medications on file prior to visit.    ROS as in subjective   Objective: BP 130/82   Pulse 63   Wt 239 lb 3.2 oz (108.5 kg)   SpO2 96%   BMI 35.07 kg/m   Gen: wd, wn, nad Left lateral superior eyelid with 13mm oval white-yellow papular well defined lesion.  nontender Right orbit medially and superiorly with a few roundish 2-75mm diameter slightly raised lesions Otherwise PERRLA, EOMi, no other mass or lesions Rest of head exam unremarkable    Assessment Encounter Diagnoses  Name Primary?  . Eyelid abnormality Yes  . Screening for lipid disorders      Plan: Eyelid abnormality - unclear if stye or xanthelasma or other?  Refer to eye doctor  His skin findings of right orbit have slight appearance of possible xanthelasma.   He will return for fasting lipid panel  Adam Flowers was seen today for bump on eye lid.  Diagnoses and all orders for this visit:  Eyelid abnormality  Screening for lipid disorders -     Lipid panel; Future

## 2017-06-17 ENCOUNTER — Ambulatory Visit: Payer: BC Managed Care – PPO | Admitting: Medical

## 2017-12-24 ENCOUNTER — Telehealth: Payer: Self-pay | Admitting: Medical

## 2017-12-24 NOTE — Telephone Encounter (Signed)
Pt was notified. [place]

## 2017-12-24 NOTE — Telephone Encounter (Signed)
Pt states that he has a fever of 103, fatigue, slight soreness in his back. Had a fever yesterday as well. Pt wants to know what he should take for the fever for now and overnight.

## 2017-12-24 NOTE — Telephone Encounter (Signed)
Take 800 mg of ibuprofen 3 times per day.

## 2018-03-24 ENCOUNTER — Telehealth: Payer: Self-pay

## 2018-03-24 ENCOUNTER — Ambulatory Visit: Payer: BC Managed Care – PPO | Admitting: Medical

## 2018-03-24 VITALS — BP 140/82 | HR 81 | Temp 97.7°F | Resp 16 | Ht 70.0 in | Wt 229.4 lb

## 2018-03-24 DIAGNOSIS — Z13220 Encounter for screening for lipoid disorders: Secondary | ICD-10-CM

## 2018-03-24 DIAGNOSIS — B009 Herpesviral infection, unspecified: Secondary | ICD-10-CM

## 2018-03-24 DIAGNOSIS — R030 Elevated blood-pressure reading, without diagnosis of hypertension: Secondary | ICD-10-CM | POA: Diagnosis not present

## 2018-03-24 DIAGNOSIS — R7301 Impaired fasting glucose: Secondary | ICD-10-CM | POA: Diagnosis not present

## 2018-03-24 DIAGNOSIS — R238 Other skin changes: Secondary | ICD-10-CM

## 2018-03-24 MED ORDER — VALACYCLOVIR HCL 1 G PO TABS: ORAL_TABLET | ORAL | 5 refills | Status: DC

## 2018-03-24 NOTE — Telephone Encounter (Signed)
Pt has an appointment today

## 2018-03-24 NOTE — Progress Notes (Signed)
Subjective: Chief Complaint  Patient presents with  . Refill    refill on medication   fasting lipid    Here for med refills, or other concerns.  Of note last physical and screening labs 2017.  He is fasting today to have fasting lipid panel and diabetes marker done  Needs refills on Valtrex.  Uses for genital herpes.  He has some bumps on his pain as he wants looked at, not sure if this is herpes or not.  It looks different than what he initially had is an outbreak in the past  Past Medical History:  Diagnosis Date  . Allergy   . Genital herpes   . Varicose vein   . Wears glasses    No current outpatient medications on file prior to visit.   No current facility-administered medications on file prior to visit.    ROS as in subjective    Objective BP 140/82   Pulse 81   Temp 97.7 F (36.5 C) (Oral)   Resp 16   Ht 5\' 10"  (1.778 m)   Wt 229 lb 6.4 oz (104.1 kg)   SpO2 96%   BMI 32.92 kg/m    BP Readings from Last 3 Encounters:  03/24/18 140/82  06/07/17 130/82  11/23/15 116/80   General appearance: alert, no distress, WD/WN,  Glans penis dorsal corona with several pink pearly papules, no other lesions, normal male genitalia, circumcised, no mass no lumps    Assessment: Encounter Diagnoses  Name Primary?  . Impaired fasting blood sugar Yes  . Screening for lipid disorders   . Herpetic lesion   . Elevated blood-pressure reading without diagnosis of hypertension   . Benign papule     Plan: Lab screening today as below  Reassured that his bumps he was concerned about are pink pearly papules of the penis  Genital herpes-continue Valtrex for acute flare up  Elevated blood pressure-counseled on diet and exercise, continue efforts to work on lifestyle changes   Morio was seen today for refill.  Diagnoses and all orders for this visit:  Impaired fasting blood sugar -     Hemoglobin A1c  Screening for lipid disorders -     Cancel: Lipid panel -      Lipid panel  Herpetic lesion  Elevated blood-pressure reading without diagnosis of hypertension  Benign papule  Other orders -     valACYclovir (VALTREX) 1000 MG tablet; 1 tablet po BID x 1 week for flare up

## 2018-03-24 NOTE — Telephone Encounter (Signed)
Pt has called to request a refill on Valtrex. Please advise

## 2018-03-24 NOTE — Addendum Note (Signed)
Addended by: Gwinda Maine on: 03/24/2018 04:11 PM   Modules accepted: Orders

## 2018-03-25 DIAGNOSIS — R030 Elevated blood-pressure reading, without diagnosis of hypertension: Secondary | ICD-10-CM | POA: Insufficient documentation

## 2018-03-25 DIAGNOSIS — B009 Herpesviral infection, unspecified: Secondary | ICD-10-CM | POA: Insufficient documentation

## 2018-03-25 LAB — LIPID PANEL
Chol/HDL Ratio: 2.3 ratio (ref 0.0–5.0)
Cholesterol, Total: 148 mg/dL (ref 100–199)
HDL: 64 mg/dL (ref >39)
LDL Calculated: 74 mg/dL (ref 0–99)
Triglycerides: 52 mg/dL (ref 0–149)
VLDL Cholesterol Cal: 10 mg/dL (ref 5–40)

## 2018-03-25 LAB — HEMOGLOBIN A1C
Est. average glucose Bld gHb Est-mCnc: 111 mg/dL
Hgb A1c MFr Bld: 5.5 % (ref 4.8–5.6)

## 2018-04-23 ENCOUNTER — Emergency Department (HOSPITAL_COMMUNITY): Payer: BC Managed Care – PPO

## 2018-04-23 ENCOUNTER — Emergency Department (HOSPITAL_COMMUNITY)
Admission: EM | Admit: 2018-04-23 | Discharge: 2018-04-24 | Disposition: A | Discharge status: Home / Self Care | Payer: BC Managed Care – PPO | Attending: Emergency Medicine | Admitting: Emergency Medicine

## 2018-04-23 ENCOUNTER — Encounter (HOSPITAL_COMMUNITY): Payer: Self-pay

## 2018-04-23 DIAGNOSIS — S53104A Unspecified dislocation of right ulnohumeral joint, initial encounter: Secondary | ICD-10-CM | POA: Insufficient documentation

## 2018-04-23 DIAGNOSIS — F1290 Cannabis use, unspecified, uncomplicated: Secondary | ICD-10-CM | POA: Diagnosis not present

## 2018-04-23 DIAGNOSIS — S52124A Nondisplaced fracture of head of right radius, initial encounter for closed fracture: Secondary | ICD-10-CM | POA: Diagnosis not present

## 2018-04-23 DIAGNOSIS — R52 Pain, unspecified: Secondary | ICD-10-CM

## 2018-04-23 DIAGNOSIS — S59901A Unspecified injury of right elbow, initial encounter: Secondary | ICD-10-CM | POA: Diagnosis present

## 2018-04-23 DIAGNOSIS — Y929 Unspecified place or not applicable: Secondary | ICD-10-CM | POA: Diagnosis not present

## 2018-04-23 DIAGNOSIS — Y9389 Activity, other specified: Secondary | ICD-10-CM | POA: Insufficient documentation

## 2018-04-23 DIAGNOSIS — W1830XA Fall on same level, unspecified, initial encounter: Secondary | ICD-10-CM | POA: Insufficient documentation

## 2018-04-23 DIAGNOSIS — Y999 Unspecified external cause status: Secondary | ICD-10-CM | POA: Insufficient documentation

## 2018-04-23 MED ORDER — PROPOFOL 10 MG/ML IV BOLUS
INTRAVENOUS | Status: AC | PRN
  Administered 2018-04-23: 10 mg via INTRAVENOUS
  Administered 2018-04-23: 20 mg via INTRAVENOUS
  Administered 2018-04-23: 10 mg via INTRAVENOUS
  Administered 2018-04-23: 20 mg via INTRAVENOUS

## 2018-04-23 MED ORDER — PROPOFOL 10 MG/ML IV BOLUS
0.5000 mg/kg | Freq: Once | INTRAVENOUS | Status: AC
  Administered 2018-04-23: 51.5 mg via INTRAVENOUS
  Filled 2018-04-23: qty 20

## 2018-04-23 MED ORDER — ONDANSETRON HCL 4 MG/2ML IJ SOLN
4.0000 mg | Freq: Once | INTRAMUSCULAR | Status: AC
  Administered 2018-04-23: 4 mg via INTRAVENOUS
  Filled 2018-04-23: qty 2

## 2018-04-23 MED ORDER — SODIUM CHLORIDE 0.9 % IV SOLN: INTRAVENOUS | Status: DC

## 2018-04-23 MED ORDER — HYDROMORPHONE HCL 1 MG/ML IJ SOLN
1.0000 mg | Freq: Once | INTRAMUSCULAR | Status: AC
  Administered 2018-04-23: 1 mg via INTRAVENOUS
  Filled 2018-04-23: qty 1

## 2018-04-23 NOTE — ED Notes (Signed)
Patient transported to X-ray 

## 2018-04-23 NOTE — ED Triage Notes (Signed)
Pt arrived by EMS. Pt was playing kickball when he fell and attempted brace fall with right arm. Pt has deformity to right elbow. Pt received 144mcg of fentanyl from EMS.

## 2018-04-23 NOTE — ED Notes (Signed)
Bed: QJ33 Expected date:  Expected time:  Means of arrival:  Comments: EMS elbow injury fentanyl

## 2018-04-23 NOTE — ED Notes (Signed)
ED Provider at bedside. 

## 2018-04-23 NOTE — Progress Notes (Signed)
RT present for duration of conscious sedation procedure.  Pt remained on EtCO2 monitoring throughout procedure.

## 2018-04-24 MED ORDER — OXYCODONE-ACETAMINOPHEN 5-325 MG PO TABS: 1.0000 | ORAL_TABLET | ORAL | 0 refills | Status: DC | PRN

## 2018-04-24 NOTE — Discharge Instructions (Signed)
Call Dr. Caralyn Guile to be seen Friday of this week. Take Percocet as prescribed.

## 2018-04-24 NOTE — ED Provider Notes (Signed)
[place] DEPT Provider Note   CSN: [id number] Arrival date & time: 04/23/18  2124     History   Chief Complaint Chief Complaint  Patient presents with  . Elbow Injury    right     HPI Adam Flowers is a 37 y.o. male.  Patient presents with right elbow injury after fall while playing kick ball this evening. He states he fell forward onto outstretched, locked elbow arm causing deformity to elbow. No other injury. He is right hand dominant. He has no significant medical history. No numbness of right extremity.   The history is provided by the patient. No language interpreter was used.    Past Medical History:  Diagnosis Date  . Allergy   . Genital herpes   . Varicose vein   . Wears glasses     Patient Active Problem List   Diagnosis Date Noted  . Elevated blood-pressure reading without diagnosis of hypertension 03/25/2018  . Herpetic lesion 03/25/2018  . Impaired fasting blood sugar 03/24/2018  . Screening for lipid disorders 11/23/2015  . Varicose veins of lower extremities with ulcer ([place]) 11/23/2015  . Chronic venous insufficiency 11/23/2015  . Venous stasis dermatitis of right lower extremity 11/23/2015  . Obesity 11/23/2015  . Genital herpes 07/17/2011  . Numbness of toes 07/17/2011  . Neoplasm of skin of lower leg 07/17/2011    Past Surgical History:  Procedure Laterality Date  . ARTHROSCOPY KNEE W/ DRILLING          Home Medications    Prior to Admission medications   Medication Sig Start Date End Date Taking? Authorizing Provider  valACYclovir (VALTREX) 1000 MG tablet 1 tablet po BID x 1 week for flare up Patient not taking: Reported on 04/23/2018 03/24/18   Tysinger, Camelia Eng, PA-C    Family History Family History  Problem Relation Age of Onset  . Kidney disease Mother   . Diabetes Father   . Cancer Neg Hx   . Heart disease Neg Hx   . Hypertension Neg Hx   . Hyperlipidemia Neg Hx   . Stroke Neg Hx      Social History Social History   Tobacco Use  . Smoking status: Never Smoker  . Smokeless tobacco: Never Used  Substance Use Topics  . Alcohol use: Yes    Alcohol/week: 3.0 oz    Types: 5 Standard drinks or equivalent per week    Comment: once monthly, minimal  . Drug use: Yes    Types: Marijuana     Allergies   Patient has no known allergies.   Review of Systems Review of Systems  Cardiovascular: Negative.  Negative for chest pain.  Gastrointestinal: Negative.  Negative for abdominal pain and nausea.  Musculoskeletal:       See HPI  Skin: Negative.  Negative for wound.  Neurological: Negative.  Negative for headaches.     Physical Exam Updated Vital Signs BP 125/81   Pulse 90   Temp 98.6 F (37 C) (Oral)   Resp 18   Ht 5\' 11"  (1.803 m)   Wt 103 kg (227 lb)   SpO2 96%   BMI 31.66 kg/m   Physical Exam  Constitutional: He is oriented to person, place, and time. He appears well-developed and well-nourished.  Neck: Normal range of motion.  Cardiovascular: Intact distal pulses.  Pulmonary/Chest: Effort normal. He exhibits no tenderness.  Abdominal: There is no tenderness.  Musculoskeletal: Normal range of motion.  Right elbow has significant swelling, appears  deformed with rotation of distal arm. Distal pulses palpable. No wrist or shoulder tenderness. Moves all digits of right hand.   Neurological: He is alert and oriented to person, place, and time.  Skin: Skin is warm and dry.  Psychiatric: He has a normal mood and affect.     ED Treatments / Results  Labs (all labs ordered are listed, but only abnormal results are displayed) Labs Reviewed - No data to display  EKG None  Radiology Dg Elbow 2 Views Right  Result Date: 04/23/2018 CLINICAL DATA:  Status post fall, with right elbow deformity. Initial encounter. EXAM: RIGHT ELBOW - 2 VIEW COMPARISON:  None. FINDINGS: There is dorsal dislocation of the elbow joint, with shortening. The radius and ulna  are lodged against the distal humerus. There appears to be an osseous fragment arising from the coronoid process, and a small fracture through the radial head. Surrounding soft tissue swelling and elbow joint effusion are noted. IMPRESSION: Dorsal dislocation of the elbow joint, with shortening. The radius and ulna are lodged against the distal humerus. There appears to be an osseous fragment arising from the coronoid process, and a small fracture through the radial head. Electronically Signed   By: Garald Balding M.D.   On: 04/23/2018 22:33    Procedures Procedures (including critical care time)  Medications Ordered in ED Medications  0.9 %  sodium chloride infusion (has no administration in time range)  HYDROmorphone (DILAUDID) injection 1 mg (1 mg Intravenous Given 04/23/18 2225)  HYDROmorphone (DILAUDID) injection 1 mg (1 mg Intravenous Given 04/23/18 2324)  ondansetron (ZOFRAN) injection 4 mg (4 mg Intravenous Given 04/23/18 2324)  propofol (DIPRIVAN) 10 mg/mL bolus/IV push 51.5 mg (51.5 mg Intravenous Given 04/23/18 2324)  propofol (DIPRIVAN) 10 mg/mL bolus/IV push (10 mg Intravenous Given 04/23/18 2334)     Initial Impression / Assessment and Plan / ED Course  I have reviewed the triage vital signs and the nursing notes.  Pertinent labs & imaging results that were available during my care of the patient were reviewed by me and considered in my medical decision making (see chart for details).     Patient presents with right elbow injury after fall during sports play.   Imaging confirms dislocated elbow joint with fracture of radial head and coronoid process.   Reduction performed by Dr. Rogene Houston under conscious sedation. Post-reduction shows improvement. Distal pulses palpated throughout procedure. Arm splinted, supported with shoulder immobilizer.   Consult to Dr. Caralyn Guile who advises he can follow up with him in clinic Friday for further management of dislocation/fracture injury.      Final Clinical Impressions(s) / ED Diagnoses   Final diagnoses:  Pain   1. Right elbow dislocation 2. Right elbow fracture  ED Discharge Orders    None       Charlann Lange, PA-C 04/24/18 [id number]    Fredia Sorrow, MD 04/26/18 [phone]

## 2018-04-24 NOTE — ED Provider Notes (Signed)
[facility] [place] DEPT Provider Note   CSN: [id number] Arrival date & time: 04/23/18  2124     History   Chief Complaint Chief Complaint  Patient presents with  . Elbow Injury    right     HPI Adam Flowers is a 37 y.o. male.  Patient was playing kickball.  Went down on an outstretched right arm.  Patient is right-hand dominant.  Patient immediately had pain and not able to bend the arm.  No other injuries.  Patient does not have a local orthopedist.  No open wounds.  Past medical history noncontributory.     Past Medical History:  Diagnosis Date  . Allergy   . Genital herpes   . Varicose vein   . Wears glasses     Patient Active Problem List   Diagnosis Date Noted  . Elevated blood-pressure reading without diagnosis of hypertension 03/25/2018  . Herpetic lesion 03/25/2018  . Impaired fasting blood sugar 03/24/2018  . Screening for lipid disorders 11/23/2015  . Varicose veins of lower extremities with ulcer ([place]) 11/23/2015  . Chronic venous insufficiency 11/23/2015  . Venous stasis dermatitis of right lower extremity 11/23/2015  . Obesity 11/23/2015  . Genital herpes 07/17/2011  . Numbness of toes 07/17/2011  . Neoplasm of skin of lower leg 07/17/2011    Past Surgical History:  Procedure Laterality Date  . ARTHROSCOPY KNEE W/ DRILLING          Home Medications    Prior to Admission medications   Medication Sig Start Date End Date Taking? Authorizing Provider  valACYclovir (VALTREX) 1000 MG tablet 1 tablet po BID x 1 week for flare up Patient not taking: Reported on 04/23/2018 03/24/18   Tysinger, Camelia Eng, PA-C    Family History Family History  Problem Relation Age of Onset  . Kidney disease Mother   . Diabetes Father   . Cancer Neg Hx   . Heart disease Neg Hx   . Hypertension Neg Hx   . Hyperlipidemia Neg Hx   . Stroke Neg Hx     Social History Social History   Tobacco Use  . Smoking status: Never Smoker  .  Smokeless tobacco: Never Used  Substance Use Topics  . Alcohol use: Yes    Alcohol/week: 3.0 oz    Types: 5 Standard drinks or equivalent per week    Comment: once monthly, minimal  . Drug use: Yes    Types: Marijuana     Allergies   Patient has no known allergies.   Review of Systems Review of Systems  Constitutional: Negative for fever.  HENT: Negative for congestion.   Eyes: Negative for visual disturbance.  Respiratory: Negative for shortness of breath.   Cardiovascular: Negative for chest pain.  Gastrointestinal: Negative for abdominal pain.  Genitourinary: Negative for hematuria.  Musculoskeletal: Negative for back pain and neck pain.  Skin: Negative for wound.  Neurological: Negative for syncope and headaches.  Hematological: Does not bruise/bleed easily.  Psychiatric/Behavioral: Negative for confusion.     Physical Exam Updated Vital Signs BP 135/89   Pulse 82   Temp 98.6 F (37 C) (Oral)   Resp 16   Ht 1.803 m (5\' 11" )   Wt 103 kg (227 lb)   SpO2 97%   BMI 31.66 kg/m   Physical Exam  Constitutional: He is oriented to person, place, and time. He appears well-developed and well-nourished. No distress.  HENT:  Head: Normocephalic and atraumatic.  Mouth/Throat: Oropharynx is clear and moist.  Eyes: Pupils are equal, round, and reactive to light. Conjunctivae and EOM are normal.  Neck: Normal range of motion. Neck supple.  Cardiovascular: Normal rate and regular rhythm.  Pulmonary/Chest: Effort normal and breath sounds normal.  Abdominal: Soft. Bowel sounds are normal. There is no tenderness.  Musculoskeletal: Normal range of motion. He exhibits deformity.  Right elbow with deformity.  And some pain not able to flex it.  No shoulder tenderness no wrist tenderness no finger tenderness.  Sensation intact cap refill intact good radial pulse.  No open wounds.  Left arm is fine.  Neurological: He is alert and oriented to person, place, and time. No cranial  nerve deficit or sensory deficit. He exhibits normal muscle tone. Coordination normal.  Skin: Skin is warm.  Nursing note and vitals reviewed.    ED Treatments / Results  Labs (all labs ordered are listed, but only abnormal results are displayed) Labs Reviewed - No data to display  EKG None  Radiology Dg Elbow 2 Views Right  Result Date: 04/23/2018 CLINICAL DATA:  Status post fall, with right elbow deformity. Initial encounter. EXAM: RIGHT ELBOW - 2 VIEW COMPARISON:  None. FINDINGS: There is dorsal dislocation of the elbow joint, with shortening. The radius and ulna are lodged against the distal humerus. There appears to be an osseous fragment arising from the coronoid process, and a small fracture through the radial head. Surrounding soft tissue swelling and elbow joint effusion are noted. IMPRESSION: Dorsal dislocation of the elbow joint, with shortening. The radius and ulna are lodged against the distal humerus. There appears to be an osseous fragment arising from the coronoid process, and a small fracture through the radial head. Electronically Signed   By: Garald Balding M.D.   On: 04/23/2018 22:33   Dg Elbow Complete Right  Result Date: 04/24/2018 CLINICAL DATA:  Status post reduction EXAM: RIGHT ELBOW - COMPLETE 3+ VIEW COMPARISON:  None. FINDINGS: Intermediate density material overlying the right elbow obscures fine osseous detail. There is markedly improved alignment of the right elbow following reduction. Fracture of the radial head is confirmed. IMPRESSION: Improved alignment of the right elbow following reduction. Confirmed right radial head fracture. Electronically Signed   By: Ulyses Jarred M.D.   On: 04/24/2018 00:25    Procedures Reduction of dislocation Date/Time: 04/24/2018 12:21 AM Performed by: Fredia Sorrow, MD Authorized by: Fredia Sorrow, MD  Consent: Written consent obtained. Consent given by: patient Patient understanding: patient states understanding of the  procedure being performed Patient consent: the patient's understanding of the procedure matches consent given Procedure consent: procedure consent matches procedure scheduled Relevant documents: relevant documents present and verified Site marked: the operative site was not marked Imaging studies: imaging studies available Required items: required blood products, implants, devices, and special equipment available Patient identity confirmed: verbally with patient and arm band Time out: Immediately prior to procedure a "time out" was called to verify the correct patient, procedure, equipment, support staff and site/side marked as required. Local anesthesia used: no  Anesthesia: Local anesthesia used: no  Sedation: Patient sedated: yes Sedatives: propofol Analgesia: hydromorphone Sedation start date/time: 04/23/2018 11:30 PM Sedation end date/time: 04/24/2018 12:00 AM Vitals: Vital signs were monitored during sedation.  Patient tolerance: Patient tolerated the procedure well with no immediate complications Comments: Patient with a posterior dislocation of the right elbow.  With a fracture of the coronoid process also a little bit of hairline fracture of the radial head.  It was difficult to get the joint to stay  in place due to the fracture.  But were able to hold it in place and get it in a long-arm splint.  Patient tolerated overall procedure well.  Patient had good radial pulse and cap refill prior procedure at the same after.  Even after placement of the splint.  Follow-up x-ray show pretty good alignment.    (including critical care time)  Medications Ordered in ED Medications  0.9 %  sodium chloride infusion (has no administration in time range)  HYDROmorphone (DILAUDID) injection 1 mg (1 mg Intravenous Given 04/23/18 2225)  HYDROmorphone (DILAUDID) injection 1 mg (1 mg Intravenous Given 04/23/18 2324)  ondansetron (ZOFRAN) injection 4 mg (4 mg Intravenous Given 04/23/18 2324)  propofol  (DIPRIVAN) 10 mg/mL bolus/IV push 51.5 mg (51.5 mg Intravenous Given 04/23/18 2324)  propofol (DIPRIVAN) 10 mg/mL bolus/IV push (10 mg Intravenous Given 04/23/18 2334)     Initial Impression / Assessment and Plan / ED Course  I have reviewed the triage vital signs and the nursing notes.  Pertinent labs & imaging results that were available during my care of the patient were reviewed by me and considered in my medical decision making (see chart for details).     PA will follow-up with Dr. Apolonio Schneiders orthopedics for follow-up.  Patient will keep the long-arm splint in place.  Final Clinical Impressions(s) / ED Diagnoses   Final diagnoses:  Closed dislocation of right elbow, initial encounter  Closed nondisplaced fracture of head of right radius, initial encounter    ED Discharge Orders    None       Fredia Sorrow, MD 04/24/18 [phone]

## 2018-04-25 ENCOUNTER — Ambulatory Visit
Admission: RE | Admit: 2018-04-25 | Discharge: 2018-04-25 | Disposition: A | Discharge status: Home / Self Care | Payer: BC Managed Care – PPO | Source: Ambulatory Visit | Attending: Physician Assistant | Admitting: Physician Assistant

## 2018-04-25 ENCOUNTER — Other Ambulatory Visit: Payer: Self-pay | Admitting: Physician Assistant

## 2018-04-25 DIAGNOSIS — S53124A Posterior dislocation of right ulnohumeral joint, initial encounter: Secondary | ICD-10-CM

## 2018-07-04 ENCOUNTER — Encounter: Payer: Self-pay | Admitting: Medical

## 2018-07-04 ENCOUNTER — Ambulatory Visit: Payer: BC Managed Care – PPO | Admitting: Medical

## 2018-07-04 VITALS — BP 120/90 | HR 65 | Temp 98.1°F | Resp 16 | Ht 71.0 in | Wt 235.4 lb

## 2018-07-04 DIAGNOSIS — Z2821 Immunization not carried out because of patient refusal: Secondary | ICD-10-CM | POA: Insufficient documentation

## 2018-07-04 DIAGNOSIS — R197 Diarrhea, unspecified: Secondary | ICD-10-CM | POA: Insufficient documentation

## 2018-07-04 DIAGNOSIS — K529 Noninfective gastroenteritis and colitis, unspecified: Secondary | ICD-10-CM | POA: Diagnosis not present

## 2018-07-04 NOTE — Progress Notes (Signed)
Subjective: Chief Complaint  Patient presents with  . upset stomach    diarrhea X 1 week   Here for diarrhea.   Went a week ago to [facility] in [place].  He passed of complications from diabetes in his 74s.  Been about a week of symptoms.   3-4 loose stools, worse after eating all this week.  Took pepto bismol, didn't help. Used imodium a few times, and it helped some.  But diarrhea has continued.  No fever, no blood in stool, has had belly cramps, but no several pain, no urinary issues.   Stool is watery brown.  Not bad foul odor or green stool.  No sick contacts with similar.  Prior to a week ago was fine.  No other aggravating or relieving factors. No other complaint.  Past Medical History:  Diagnosis Date  . Allergy   . Genital herpes   . Varicose vein   . Wears glasses    Current Outpatient Medications on File Prior to Visit  Medication Sig Dispense Refill  . valACYclovir (VALTREX) 1000 MG tablet 1 tablet po BID x 1 week for flare up 28 tablet 5  . oxyCODONE-acetaminophen (PERCOCET/ROXICET) 5-325 MG tablet Take 1-2 tablets by mouth every 4 (four) hours as needed for severe pain. (Patient not taking: Reported on 07/04/2018) 15 tablet 0   No current facility-administered medications on file prior to visit.    ROS as in subjective    Objective: BP 120/90   Pulse 65   Temp 98.1 F (36.7 C) (Oral)   Resp 16   Ht 5\' 11"  (1.803 m)   Wt 235 lb 6.4 oz (106.8 kg)   SpO2 97%   BMI 32.83 kg/m    Wt Readings from Last 3 Encounters:  07/04/18 235 lb 6.4 oz (106.8 kg)  04/23/18 227 lb (103 kg)  03/24/18 229 lb 6.4 oz (104.1 kg)   General appearance: alert, no distress, WD/WN,  Oral cavity: MMM, no lesions Neck: supple, no lymphadenopathy, no thyromegaly, no masses Abdomen: +bs, soft, non tender, non distended, no masses, no hepatomegaly, no splenomegaly Pulses: 2+ symmetric, upper and lower extremities, normal cap refill   Assessment: Encounter Diagnoses  Name  Primary?  . Diarrhea, unspecified type Yes  . Gastroenteritis   . Influenza vaccination declined     Plan: Discussed symptoms which suggest acute viral gastroenteritis.   Discussed supportive care.  Advised they drink enough fluids to keep your urine clear or pale yellow. Drink small amounts of fluids frequently and increase the amounts as tolerated.  Avoid food that could worsen symptoms (sugary foods, alcohol,carbonated drinks, extremely hot or cold fluids, juice, fatty foods, greasy foods, dairy, and large portions).   Advised that they can use Pepto bismol, yogurt, Tylenol prn for fever/aches, wash hands frequently.  Call or recheck if worse, not improving within 3-5 days, or if new symptoms such as fever, bloody stool, or inability to keep anything down by mouth.    Axcel was seen today for upset stomach.  Diagnoses and all orders for this visit:  Diarrhea, unspecified type  Gastroenteritis  Influenza vaccination declined

## 2018-11-06 IMAGING — CT CT 3D INDEPENDENT WKST
1 series · 1 of 3 positions shown · non-contrast
Comparison: None.

CLINICAL DATA: Right elbow fracture fell and dislocated elbow
04-23-18. Patient is casted.

EXAM:
CT OF THE LOWER RIGHT EXTREMITY WITHOUT CONTRAST
CT - 3-DIMENSIONAL CT IMAGE RENDERING ON INDEPENDENT WORKSTATION
TECHNIQUE: Multidetector CT imaging of the right lower extremity was performed
according to the standard protocol.

[Series 320: turn · 0.73mm/px · 1 of 3 slices shown]
[im 2/3]
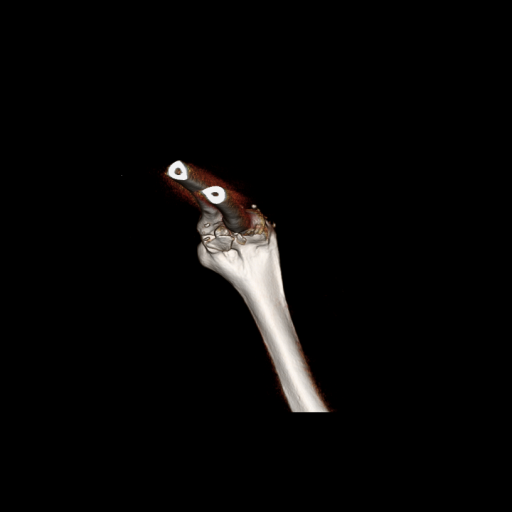

[1 of 3 positions shown; findings below may reference images not displayed]

FINDINGS: Bones/Joint/Cartilage

Comminuted fracture of the radial head along the volar aspect
involving the margin of the articular surface. There is a fracture
fragment from the volar aspect of the radial head which is displaced
and insinuated between the fractured coronoid process.

Comminuted fracture of the coronoid process with 7 mm of anterior
displacement.

No other acute fracture or dislocation. No fluid collection or
hematoma. Small joint effusion.

Ligaments

Suboptimally assessed by CT.

Muscles and Tendons

Muscles are normal.  Triceps tendon and biceps tendon are intact.

Soft tissues

No fluid collection or hematoma.  No soft tissue mass.
IMPRESSION: 1. Comminuted fracture of the radial head along the volar aspect
involving the margin of the articular surface. There is a fracture
fragment from the volar aspect of the radial head which is displaced
and insinuated between the fractured coronoid process.
2. Comminuted fracture of the coronoid process with 7 mm of anterior
displacement.

## 2019-08-05 DIAGNOSIS — Z20828 Contact with and (suspected) exposure to other viral communicable diseases: Secondary | ICD-10-CM | POA: Diagnosis not present

## 2019-08-05 DIAGNOSIS — Z7189 Other specified counseling: Secondary | ICD-10-CM | POA: Diagnosis not present

## 2019-11-13 ENCOUNTER — Telehealth: Payer: Self-pay | Admitting: Medical

## 2019-11-13 ENCOUNTER — Other Ambulatory Visit: Payer: Self-pay | Admitting: Medical

## 2019-11-13 MED ORDER — VALACYCLOVIR HCL 1 G PO TABS: ORAL_TABLET | ORAL | 0 refills | Status: DC

## 2019-11-13 NOTE — Telephone Encounter (Signed)
Pt called for refills of Valtrex. Please send to [facility]. Pt can be reached at [phone].

## 2019-11-13 NOTE — Telephone Encounter (Signed)
Medication sent.  He is due at this time for a physical as his last visit was over 1 year

## 2020-05-30 DIAGNOSIS — Z20822 Contact with and (suspected) exposure to covid-19: Secondary | ICD-10-CM | POA: Diagnosis not present

## 2020-07-18 ENCOUNTER — Encounter: Payer: Self-pay | Admitting: Medical

## 2020-07-18 ENCOUNTER — Ambulatory Visit: Payer: BLUE CROSS/BLUE SHIELD | Admitting: Medical

## 2020-07-18 ENCOUNTER — Other Ambulatory Visit: Payer: Self-pay

## 2020-07-18 VITALS — BP 122/82 | HR 88 | Ht 70.0 in | Wt 250.8 lb

## 2020-07-18 DIAGNOSIS — I8391 Asymptomatic varicose veins of right lower extremity: Secondary | ICD-10-CM | POA: Diagnosis not present

## 2020-07-18 DIAGNOSIS — M25461 Effusion, right knee: Secondary | ICD-10-CM

## 2020-07-18 DIAGNOSIS — M25561 Pain in right knee: Secondary | ICD-10-CM | POA: Insufficient documentation

## 2020-07-18 MED ORDER — NAPROXEN 500 MG PO TABS: 500.0000 mg | ORAL_TABLET | Freq: Two times a day (BID) | ORAL | 0 refills | Status: DC

## 2020-07-18 NOTE — Progress Notes (Signed)
Subjective:  Adam Flowers is a 39 y.o. male who presents for Chief Complaint  Patient presents with  . Joint Swelling    right-no known injury      Here for complaint of right knee and swelling x5 days.  He was doing some yard work last week installing a retaining wall.  He was down on his right knee a lot at that time.  Otherwise he denies any other injury fall or trauma.  No break in the skin or wound.  His main symptoms have been swelling and pain.  He can still bend the leg mostly without much pain other than when he flexes the leg gets somewhat reduced and range of motion.  He denies warmth, redness, fever, body aches or chills.  He has tried some icing and some elevation but no other treatment.  He was wondering if his weight was contributing to this symptom  Otherwise has been in normal state of health.  He does have history of left knee arthroscopic surgery for meniscus repair in the past.  No other aggravating or relieving factors.    No other c/o.  The following portions of the patient's history were reviewed and updated as appropriate: allergies, current medications, past family history, past medical history, past social history, past surgical history and problem list.  ROS Otherwise as in subjective above  Objective: BP 122/82   Pulse 88   Ht 5\' 10"  (1.778 m)   Wt 250 lb 12.8 oz (113.8 kg)   SpO2 94%   BMI 35.99 kg/m   General appearance: alert, no distress, well developed, well nourished Moderate swelling of the knee on the right, particularly superior to the patella.  No laxity of the joint, no tenderness of the joint line, no pain with special tests.  He seems to be able to ambulate just fine.  Right knee flexion is mildly reduced but otherwise range of motion is full.  Fluid is not ballotable.  There are moderate varicose veins of the right lower leg and some asymmetry of the right lower leg being larger in diameter than the left.  No pitting edema.  Otherwise  legs nontender, normal range of motion, otherwise unremarkable Legs neurovascularly intact   Assessment: Encounter Diagnoses  Name Primary?  . Acute pain of right knee Yes  . Pain and swelling of right knee   . Varicose veins of right lower extremity, unspecified whether complicated      Plan: Pain and swelling likely related to inflammation from being down on his knee working in the yard last week.  Advised medication below, ice, elevation, rest, possibly compression with Ace wrap or knee sleeve.  If not much improved within a week then recheck.  Or if worse within the next 2 days recheck.  If any signs of infection or worsening signs such as redness, fever, warmth, or swelling or pain, then recheck right away.  No indication to suspect infected joint today.  Of note he does have varicose veins of the right lower leg which he notes long-term asymmetry of the right leg being larger than the left mostly due to the varicose vein findings and dependent edema  Tytus was seen today for joint swelling.  Diagnoses and all orders for this visit:  Acute pain of right knee  Pain and swelling of right knee  Varicose veins of right lower extremity, unspecified whether complicated  Other orders -     naproxen (NAPROSYN) 500 MG tablet; Take 1 tablet (500 mg  total) by mouth 2 (two) times daily with a meal.    Follow up: soon for fasting physical

## 2020-07-30 ENCOUNTER — Other Ambulatory Visit: Payer: Self-pay | Admitting: Medical

## 2020-08-26 ENCOUNTER — Other Ambulatory Visit: Payer: Self-pay

## 2020-08-26 ENCOUNTER — Encounter: Payer: Self-pay | Admitting: Medical

## 2020-08-26 ENCOUNTER — Ambulatory Visit (INDEPENDENT_AMBULATORY_CARE_PROVIDER_SITE_OTHER): Payer: BLUE CROSS/BLUE SHIELD | Admitting: Medical

## 2020-08-26 VITALS — BP 132/88 | HR 85 | Ht 70.0 in | Wt 253.2 lb

## 2020-08-26 DIAGNOSIS — Z7185 Encounter for immunization safety counseling: Secondary | ICD-10-CM | POA: Diagnosis not present

## 2020-08-26 DIAGNOSIS — R7301 Impaired fasting glucose: Secondary | ICD-10-CM | POA: Diagnosis not present

## 2020-08-26 DIAGNOSIS — I8390 Asymptomatic varicose veins of unspecified lower extremity: Secondary | ICD-10-CM | POA: Insufficient documentation

## 2020-08-26 DIAGNOSIS — Z0000 Encounter for general adult medical examination without abnormal findings: Secondary | ICD-10-CM

## 2020-08-26 DIAGNOSIS — Z13220 Encounter for screening for lipoid disorders: Secondary | ICD-10-CM

## 2020-08-26 NOTE — Progress Notes (Signed)
Subjective:   HPI  Adam Flowers is a 39 y.o. male who presents for Chief Complaint  Patient presents with  . Annual Exam    Physical not fasting     Patient Care Team: Maison Agrusa, Leward Quan as PCP - General Sees dentist Sees eye doctor  Concerns: none  Reviewed their medical, surgical, family, social, medication, and allergy history and updated chart as appropriate.  Past Medical History:  Diagnosis Date  . Allergy   . Genital herpes   . Varicose vein   . Wears glasses     Past Surgical History:  Procedure Laterality Date  . ARTHROSCOPY KNEE W/ DRILLING      Family History  Problem Relation Age of Onset  . Kidney disease Mother   . Diabetes Father   . Cancer Neg Hx   . Heart disease Neg Hx   . Hypertension Neg Hx   . Hyperlipidemia Neg Hx   . Stroke Neg Hx     No current outpatient medications on file.  No Known Allergies     Review of Systems Constitutional: -fever, -chills, -sweats, -unexpected weight change, -decreased appetite, -fatigue Allergy: -sneezing, -itching, -congestion Dermatology: -changing moles, --rash, -lumps ENT: -runny nose, -ear pain, -sore throat, -hoarseness, -sinus pain, -teeth pain, - ringing in ears, -hearing loss, -nosebleeds Cardiology: -chest pain, -palpitations, -swelling, -difficulty breathing when lying flat, -waking up short of breath Respiratory: -cough, -shortness of breath, -difficulty breathing with exercise or exertion, -wheezing, -coughing up blood Gastroenterology: -abdominal pain, -nausea, -vomiting, -diarrhea, -constipation, -blood in stool, -changes in bowel movement, -difficulty swallowing or eating Hematology: -bleeding, -bruising  Musculoskeletal: -joint aches, -muscle aches, -joint swelling, -back pain, -neck pain, -cramping, -changes in gait Ophthalmology: denies vision changes, eye redness, itching, discharge Urology: -burning with urination, -difficulty urinating, -blood in urine, -urinary frequency,  -urgency, -incontinence Neurology: -headache, -weakness, -tingling, -numbness, -memory loss, -falls, -dizziness Psychology: -depressed mood, -agitation, -sleep problems Male GU: no testicular mass, pain, no lymph nodes swollen, no swelling, no rash.     Objective:  BP 132/88   Pulse 85   Ht 5\' 10"  (1.778 m)   Wt 253 lb 3.2 oz (114.9 kg)   SpO2 95%   BMI 36.33 kg/m   General appearance: alert, no distress, WD/WN, African American male Skin: unremarkable HEENT: normocephalic, conjunctiva/corneas normal, sclerae anicteric, PERRLA, EOMi, nares patent, no discharge or erythema, pharynx normal Oral cavity: MMM, tongue normal, teeth normal Neck: supple, no lymphadenopathy, no thyromegaly, no masses, normal ROM, no bruits Chest: non tender, normal shape and expansion Heart: RRR, normal S1, S2, no murmurs Lungs: CTA bilaterally, no wheezes, rhonchi, or rales Abdomen: +bs, soft, non tender, non distended, no masses, no hepatomegaly, no splenomegaly, no bruits Back: non tender, normal ROM, no scoliosis Musculoskeletal: upper extremities non tender, no obvious deformity, normal ROM throughout, lower extremities non tender, no obvious deformity, normal ROM throughout Extremities: no edema, no cyanosis, no clubbing Pulses: 2+ symmetric, upper and lower extremities, normal cap refill Neurological: alert, oriented x 3, CN2-12 intact, strength normal upper extremities and lower extremities, sensation normal throughout, DTRs 2+ throughout, no cerebellar signs, gait normal Psychiatric: normal affect, behavior normal, pleasant  GU: normal male external genitalia,circumcised, nontender, no masses, no hernia, no lymphadenopathy Rectal:deferred   Assessment and Plan :   Encounter Diagnoses  Name Primary?  . Encounter for health maintenance examination in adult Yes  . Impaired fasting blood sugar   . Screening for lipid disorders   . Vaccine counseling   . Varicose  veins of lower extremity,  unspecified laterality, unspecified whether complicated     Today you had a preventative care visit or wellness visit.    Topics today may have included healthy lifestyle, diet, exercise, preventative care, vaccinations, sick and well care, proper use of emergency dept and after hours care, as well as other concerns.     Recommendations: Continue to return yearly for your annual wellness and preventative care visits.  This gives [place] a chance to discuss healthy lifestyle, exercise, vaccinations, review your chart record, and perform screenings where appropriate.  I recommend you see your eye doctor yearly for routine vision care.  I recommend you see your dentist yearly for routine dental care including hygiene visits twice yearly.   Vaccination recommendations were reviewed Up to date on covid and td vaccines Declines flu shot  Screening for cancer: Colon cancer screening:  Age 89  Cancer screening You should do a monthly self testicular exam   We discussed PSA, prostate exam, and prostate cancer screening risks/benefits.   Age 34yo  Skin cancer screening: Check your skin regularly for new changes, growing lesions, or other lesions of concern Come in for evaluation if you have skin lesions of concern.  Lung cancer screening: If you have a greater than 30 pack year history of tobacco use, then you qualify for lung cancer screening with a chest CT scan  We currently don't have screenings for other cancers besides breast, cervical, colon, and lung cancers.  If you have a strong family history of cancer or have other cancer screening concerns, please let me know.    Bone health: Get at least 150 minutes of aerobic exercise weekly Get weight bearing exercise at least once weekly   Heart health: Get at least 150 minutes of aerobic exercise weekly Limit alcohol It is important to maintain a healthy blood pressure and healthy cholesterol numbers   Separate significant issues  discussed: Return next week for fasting labs  Work on weight loss efforts with healthy lifestyle  varicose veins - advised compression socks regularly   Griffin was seen today for annual exam.  Diagnoses and all orders for this visit:  Encounter for health maintenance examination in adult -     CBC; Future -     Lipid panel; Future -     Hemoglobin A1c; Future -     Comprehensive metabolic panel; Future  Impaired fasting blood sugar -     Hemoglobin A1c; Future  Screening for lipid disorders -     Lipid panel; Future  Vaccine counseling  Varicose veins of lower extremity, unspecified laterality, unspecified whether complicated     Follow-up pending labs, yearly for physical

## 2020-08-30 ENCOUNTER — Other Ambulatory Visit: Payer: Self-pay

## 2020-08-30 ENCOUNTER — Other Ambulatory Visit: Payer: BLUE CROSS/BLUE SHIELD

## 2020-08-30 ENCOUNTER — Ambulatory Visit (INDEPENDENT_AMBULATORY_CARE_PROVIDER_SITE_OTHER): Payer: BLUE CROSS/BLUE SHIELD

## 2020-08-30 DIAGNOSIS — Z23 Encounter for immunization: Secondary | ICD-10-CM

## 2020-08-30 DIAGNOSIS — Z0000 Encounter for general adult medical examination without abnormal findings: Secondary | ICD-10-CM

## 2020-08-30 DIAGNOSIS — R7301 Impaired fasting glucose: Secondary | ICD-10-CM

## 2020-08-30 DIAGNOSIS — Z13220 Encounter for screening for lipoid disorders: Secondary | ICD-10-CM | POA: Diagnosis not present

## 2020-08-30 LAB — LIPID PANEL
Chol/HDL Ratio: 3 ratio (ref 0.0–5.0)
Cholesterol, Total: 151 mg/dL (ref 100–199)
HDL: 50 mg/dL (ref >39)
LDL Chol Calc (NIH): 90 mg/dL (ref 0–99)
Triglycerides: 49 mg/dL (ref 0–149)
VLDL Cholesterol Cal: 11 mg/dL (ref 5–40)

## 2020-08-30 LAB — CBC
Hematocrit: 43.8 % (ref 37.5–51.0)
Hemoglobin: 15.4 g/dL (ref 13.0–17.7)
MCH: 28.2 pg (ref 26.6–33.0)
MCHC: 35.2 g/dL (ref 31.5–35.7)
MCV: 80 fL (ref 79–97)
Platelets: 255 10*3/uL (ref 150–450)
RBC: 5.47 x10E6/uL (ref 4.14–5.80)
RDW: 13.1 % (ref 11.6–15.4)
WBC: 5.1 10*3/uL (ref 3.4–10.8)

## 2020-08-30 LAB — COMPREHENSIVE METABOLIC PANEL
ALT: 38 IU/L (ref 0–44)
AST: 20 IU/L (ref 0–40)
Albumin/Globulin Ratio: 1.5 (ref 1.2–2.2)
Albumin: 4.8 g/dL (ref 4.0–5.0)
Alkaline Phosphatase: 68 IU/L (ref 44–121)
BUN/Creatinine Ratio: 11 (ref 9–20)
BUN: 15 mg/dL (ref 6–20)
Bilirubin Total: 0.6 mg/dL (ref 0.0–1.2)
CO2: 24 mmol/L (ref 20–29)
Calcium: 9.8 mg/dL (ref 8.7–10.2)
Chloride: 101 mmol/L (ref 96–106)
Creatinine, Ser: 1.41 mg/dL — ABNORMAL HIGH (ref 0.76–1.27)
GFR calc Af Amer: 73 mL/min/{1.73_m2} (ref >59)
GFR calc non Af Amer: 63 mL/min/{1.73_m2} (ref >59)
Globulin, Total: 3.3 g/dL (ref 1.5–4.5)
Glucose: 102 mg/dL — ABNORMAL HIGH (ref 65–99)
Potassium: 4.6 mmol/L (ref 3.5–5.2)
Sodium: 138 mmol/L (ref 134–144)
Total Protein: 8.1 g/dL (ref 6.0–8.5)

## 2020-08-30 LAB — HEMOGLOBIN A1C
Est. average glucose Bld gHb Est-mCnc: 111 mg/dL
Hgb A1c MFr Bld: 5.5 % (ref 4.8–5.6)

## 2020-08-31 ENCOUNTER — Other Ambulatory Visit: Payer: BLUE CROSS/BLUE SHIELD

## 2020-08-31 ENCOUNTER — Other Ambulatory Visit: Payer: Self-pay | Admitting: Medical

## 2020-08-31 DIAGNOSIS — R7989 Other specified abnormal findings of blood chemistry: Secondary | ICD-10-CM

## 2020-09-20 ENCOUNTER — Other Ambulatory Visit: Payer: Self-pay

## 2020-09-20 ENCOUNTER — Other Ambulatory Visit: Payer: BLUE CROSS/BLUE SHIELD

## 2020-09-20 DIAGNOSIS — R7989 Other specified abnormal findings of blood chemistry: Secondary | ICD-10-CM

## 2020-09-20 LAB — BASIC METABOLIC PANEL
BUN/Creatinine Ratio: 10 (ref 9–20)
BUN: 14 mg/dL (ref 6–20)
CO2: 22 mmol/L (ref 20–29)
Calcium: 9.4 mg/dL (ref 8.7–10.2)
Chloride: 100 mmol/L (ref 96–106)
Creatinine, Ser: 1.39 mg/dL — ABNORMAL HIGH (ref 0.76–1.27)
GFR calc Af Amer: 74 mL/min/{1.73_m2} (ref >59)
GFR calc non Af Amer: 64 mL/min/{1.73_m2} (ref >59)
Glucose: 94 mg/dL (ref 65–99)
Potassium: 4.6 mmol/L (ref 3.5–5.2)
Sodium: 138 mmol/L (ref 134–144)

## 2020-09-21 ENCOUNTER — Other Ambulatory Visit: Payer: BLUE CROSS/BLUE SHIELD

## 2020-09-26 ENCOUNTER — Telehealth: Payer: Self-pay | Admitting: Medical

## 2020-09-26 NOTE — Telephone Encounter (Signed)
Pt called about his lab work but it did not look like they where resulted please advise

## 2020-09-27 NOTE — Telephone Encounter (Signed)
See lab message 

## 2020-09-27 NOTE — Telephone Encounter (Signed)
Left message on machine that results were sent via mychart and patient can call the office should he have additional questions or concerns about results.

## 2020-10-20 ENCOUNTER — Encounter: Payer: Self-pay | Admitting: Family Medicine

## 2020-10-20 ENCOUNTER — Ambulatory Visit (INDEPENDENT_AMBULATORY_CARE_PROVIDER_SITE_OTHER): Payer: BLUE CROSS/BLUE SHIELD | Admitting: Family Medicine

## 2020-10-20 ENCOUNTER — Other Ambulatory Visit: Payer: Self-pay

## 2020-10-20 VITALS — BP 120/80 | HR 73 | Temp 97.2°F | Resp 16 | Wt 252.6 lb

## 2020-10-20 DIAGNOSIS — R0789 Other chest pain: Secondary | ICD-10-CM

## 2020-10-20 MED ORDER — IBUPROFEN 800 MG PO TABS: 800.0000 mg | ORAL_TABLET | Freq: Three times a day (TID) | ORAL | 0 refills | Status: DC | PRN

## 2020-10-20 NOTE — Patient Instructions (Signed)

## 2020-10-20 NOTE — Progress Notes (Signed)
   Subjective:    Patient ID: Adam Flowers, male    DOB: 07-02-1981, 40 y.o.   MRN: [id number]  HPI Chief Complaint  Patient presents with  . chest pain    Chest pain started last night, pain scale- 4 sharp pain every now and again. Harder to breath when laying down. When turning head to right he feels a pulling sensation   Complains of left sided chest pain with movement. Pain onset yesterday at 7pm. Pain is sharp and is non radiating.   States he was doing push ups 2 days prior.   States he had a headache yesterday with nasal congestion so he went home from work and took [employer]. Pain was present when he woke up.    Denies smoking.   No family history of heart disease.   Denies fever, chills, dizziness, palpitations, shortness of breath, cough, abdominal pain, N/V/D, urinary symptoms, LE edema.   Reviewed allergies, medications, past medical, surgical, family, and social history.     Review of Systems Pertinent positives and negatives in the history of present illness.     Objective:   Physical Exam BP 120/80   Pulse 73   Temp (!) 97.2 F (36.2 C)   Resp 16   Wt 252 lb 9.6 oz (114.6 kg)   SpO2 99%   BMI 36.24 kg/m   Alert and in no distress. . Cardiac exam shows a regular sinus rhythm without murmurs or gallops. Chest wall tenderness over left pectoral muscle and pain elicited with movement.  Lungs are clear to auscultation. Extremities without edema. Skin is warm and dry.        Assessment & Plan:  Left-sided chest wall pain - Plan: EKG 12-Lead, ibuprofen (ADVIL) 800 MG tablet  Chest wall tenderness - Plan: ibuprofen (ADVIL) 800 MG tablet  EKG shows NSR, rate 78. Unremarkable. Read by myself and Dr. Redmond School.  Symptoms most likely chest wall muscle strain. No sign of ACS Discussed avoiding exertion of upper torso until healed.  Take ibuprofen as prescribed with food.  Use heat and a topical analgesic as well.  Follow up if worsening or any new symptoms  arise.

## 2020-10-27 ENCOUNTER — Other Ambulatory Visit: Payer: Self-pay | Admitting: Family Medicine

## 2020-10-27 DIAGNOSIS — R0789 Other chest pain: Secondary | ICD-10-CM

## 2020-10-27 NOTE — Telephone Encounter (Signed)
Pt does not need a refill

## 2020-10-27 NOTE — Telephone Encounter (Signed)
Please see if he still needs this medication for his chest wall pain.  If he is still having symptoms then he should follow-up with his PCP, Dorothea Ogle.

## 2020-10-27 NOTE — Telephone Encounter (Signed)
Ok to refill 

## 2021-01-17 ENCOUNTER — Encounter: Payer: Self-pay | Admitting: Medical

## 2021-01-17 ENCOUNTER — Telehealth: Payer: BC Managed Care – PPO | Admitting: Medical

## 2021-01-17 ENCOUNTER — Other Ambulatory Visit: Payer: Self-pay

## 2021-01-17 VITALS — Temp 98.6°F | Ht 70.0 in | Wt 238.0 lb

## 2021-01-17 DIAGNOSIS — H1013 Acute atopic conjunctivitis, bilateral: Secondary | ICD-10-CM | POA: Diagnosis not present

## 2021-01-17 DIAGNOSIS — J301 Allergic rhinitis due to pollen: Secondary | ICD-10-CM | POA: Insufficient documentation

## 2021-01-17 MED ORDER — OLOPATADINE HCL 0.2 % OP SOLN: 1.0000 [drp] | Freq: Two times a day (BID) | OPHTHALMIC | 2 refills | Status: DC

## 2021-01-17 MED ORDER — FLUTICASONE PROPIONATE 50 MCG/ACT NA SUSP
2.0000 | Freq: Every day | NASAL | 6 refills | Status: DC
Start: 2021-01-17 — End: 2021-09-06

## 2021-01-17 MED ORDER — FEXOFENADINE HCL 180 MG PO TABS: 180.0000 mg | ORAL_TABLET | Freq: Every day | ORAL | 6 refills | Status: DC

## 2021-01-17 NOTE — Progress Notes (Signed)
Subjective:     Patient ID: Adam Flowers, male   DOB: Aug 16, 1981, 40 y.o.   MRN: [id number]  This visit type was conducted due to national recommendations for restrictions regarding the COVID-19 Pandemic (e.g. social distancing) in an effort to limit this patient's exposure and mitigate transmission in our community.  Due to their co-morbid illnesses, this patient is at least at moderate risk for complications without adequate follow up.  This format is felt to be most appropriate for this patient at this time.    Documentation for virtual audio and video telecommunications through [facility] encounter:  The patient was located at home. The provider was located in the office. The patient did consent to this visit and is aware of possible charges through their insurance for this visit.  The other persons participating in this telemedicine service were none. Time spent on call was 20 minutes and in review of previous records 20 minutes total.  This virtual service is not related to other E/M service within previous 7 days.   HPI Chief Complaint  Patient presents with  . Sinus Problem    Sinus drainage and coughing started Sunday    Virtual consult today for allergies.  He notes spring seasonal allergies worse with pollen but really got kicked it worse in the last 3 days.  He recently changed from Zyrtec to Claritin.  He is using simply saline flush.  Overall not seeing benefit.  He reports nasal congestion, sneeizing, no fever, mucous clear, itchy watery eyes, no thick yellow-green drainage.  No nausea, no sore throat, not much cough.  It does not feel ill.  No body aches or chills.  No sick contacts.  Otherwise normal state of health  Past Medical History:  Diagnosis Date  . Allergy   . Genital herpes   . Varicose vein   . Wears glasses      Review of Systems As in subjective    Objective:   Physical Exam Due to coronavirus pandemic stay at home measures, patient visit  was virtual and they were not examined in person.   Temp 98.6 F (37 C)   Ht 5\' 10"  (1.778 m)   Wt 238 lb (108 kg)   BMI 34.15 kg/m  General: Well-developed well-nourished no acute distress He sounds congested in the head No dyspnea, answers questions in complete sentences     Assessment:     Encounter Diagnoses  Name Primary?  . Seasonal allergic rhinitis due to pollen Yes  . Allergic conjunctivitis of both eyes        Plan:     We discussed symptoms and concerns.  Begin regimen below.  Continue simply saline flush at night, stop Claritin, stop Zyrtec.  If not much improved within the next week and a half call back.  I suspect he will be able to wean back off of these medicines in June as the pollen calms down.  Otherwise follow-up prn  Adron was seen today for sinus problem.  Diagnoses and all orders for this visit:  Seasonal allergic rhinitis due to pollen  Allergic conjunctivitis of both eyes  Other orders -     fluticasone (FLONASE) 50 MCG/ACT nasal spray; Place 2 sprays into both nostrils daily. -     Olopatadine HCl (PATADAY) 0.2 % SOLN; Apply 1 drop to eye in the morning and at bedtime. -     fexofenadine (ALLEGRA ALLERGY) 180 MG tablet; Take 1 tablet (180 mg total) by mouth daily.  f/u prn

## 2021-06-14 DIAGNOSIS — Z20822 Contact with and (suspected) exposure to covid-19: Secondary | ICD-10-CM | POA: Diagnosis not present

## 2021-09-06 ENCOUNTER — Other Ambulatory Visit: Payer: Self-pay

## 2021-09-06 ENCOUNTER — Ambulatory Visit: Payer: BC Managed Care – PPO | Admitting: Medical

## 2021-09-06 VITALS — BP 132/88 | HR 79 | Wt 247.0 lb

## 2021-09-06 DIAGNOSIS — R030 Elevated blood-pressure reading, without diagnosis of hypertension: Secondary | ICD-10-CM | POA: Diagnosis not present

## 2021-09-06 DIAGNOSIS — R7989 Other specified abnormal findings of blood chemistry: Secondary | ICD-10-CM | POA: Diagnosis not present

## 2021-09-06 DIAGNOSIS — N529 Male erectile dysfunction, unspecified: Secondary | ICD-10-CM | POA: Diagnosis not present

## 2021-09-06 NOTE — Progress Notes (Signed)
Subjective:  Adam Flowers is a 40 y.o. male who presents for Chief Complaint  Patient presents with   Erectile Dysfunction    For 2-3 months having difficulty maintaining erection     Having some ED issues.  Mostly having trouble keeping erections ,but sometimes trouble getting erections.  Been a problem the last few months.  Last morning erections was a while ago, not sure how long but likely months or more than a year.  Still can get some erections on his own.   No chest pain, no SOB, no leg swelling.  He did note some stress.  Sometimes his wife gets frustrated with him and the erectile dysfunction.  He denies other marital conflict.  Rare alcohol maybe once monthly.  Nonsmoker.  Here to recheck on prior abnormal labs for creatinine.    Otherwise normal state of health  No other aggravating or relieving factors.    No other c/o.  Past Medical History:  Diagnosis Date   Allergy    Genital herpes    Varicose vein    Wears glasses    No current outpatient medications on file prior to visit.   No current facility-administered medications on file prior to visit.    The following portions of the patient's history were reviewed and updated as appropriate: allergies, current medications, past family history, past medical history, past social history, past surgical history and problem list.  ROS Otherwise as in subjective above  Objective: BP 132/88 (BP Location: Right Arm, Patient Position: Sitting)    Pulse 79    Wt 247 lb (112 kg)    SpO2 99%    BMI 35.44 kg/m   BP Readings from Last 3 Encounters:  09/06/21 132/88  10/20/20 120/80  08/26/20 132/88   General appearance: alert, no distress, well developed, well nourished Neck: supple, no lymphadenopathy, no thyromegaly, no masses, no bruits Heart: RRR, normal S1, S2, no murmurs Lungs: CTA bilaterally, no wheezes, rhonchi, or rales Abdomen: +bs, soft, non tender, non distended, no masses, no hepatomegaly, no  splenomegaly Pulses: 2+ radial pulses, 2+ pedal pulses, normal cap refill Ext: no edema GU: Normal male, circumcised, no mass or lymphadenopathy, no hernia   Assessment: Encounter Diagnoses  Name Primary?   Erectile dysfunction, unspecified erectile dysfunction type Yes   Elevated blood-pressure reading, without diagnosis of hypertension    Elevated serum creatinine      Plan: We discussed the differential for erectile dysfunction.  We discussed prior findings of elevated blood pressures and elevated serum creatinine.  Labs as below today.  He will check blood pressures outside of our office and keep a log of the blood pressure readings.  He has been borderline in the last year or 2.  We discussed screening for low testosterone.  We discussed possible medications and treatments for ED including Viagra, Cialis, sildenafil and proper use of medications.  Possible side effects of medications.  Adam Flowers was seen today for erectile dysfunction.  Diagnoses and all orders for this visit:  Erectile dysfunction, unspecified erectile dysfunction type -     Urinalysis, Routine w reflex microscopic -     Testosterone -     TSH -     Basic metabolic panel  Elevated blood-pressure reading, without diagnosis of hypertension -     Urinalysis, Routine w reflex microscopic -     Testosterone -     TSH -     Basic metabolic panel  Elevated serum creatinine -     Urinalysis,  Routine w reflex microscopic -     Testosterone -     TSH -     Basic metabolic panel    Follow up: Pending labs

## 2021-09-07 ENCOUNTER — Other Ambulatory Visit: Payer: Self-pay | Admitting: Medical

## 2021-09-07 LAB — URINALYSIS, ROUTINE W REFLEX MICROSCOPIC
Bilirubin, UA: NEGATIVE
Glucose, UA: NEGATIVE
Ketones, UA: NEGATIVE
Leukocytes,UA: NEGATIVE
Nitrite, UA: NEGATIVE
RBC, UA: NEGATIVE
Specific Gravity, UA: 1.029 (ref 1.005–1.030)
Urobilinogen, Ur: 1 mg/dL (ref 0.2–1.0)
pH, UA: 7 (ref 5.0–7.5)

## 2021-09-07 LAB — TESTOSTERONE: Testosterone: 292 ng/dL (ref 264–916)

## 2021-09-07 LAB — BASIC METABOLIC PANEL
BUN/Creatinine Ratio: 12 (ref 9–20)
BUN: 15 mg/dL (ref 6–20)
CO2: 26 mmol/L (ref 20–29)
Calcium: 9.8 mg/dL (ref 8.7–10.2)
Chloride: 101 mmol/L (ref 96–106)
Creatinine, Ser: 1.25 mg/dL (ref 0.76–1.27)
Glucose: 95 mg/dL (ref 70–99)
Potassium: 4.6 mmol/L (ref 3.5–5.2)
Sodium: 140 mmol/L (ref 134–144)
eGFR: 75 mL/min/{1.73_m2} (ref >59)

## 2021-09-07 LAB — TSH: TSH: 1.69 u[IU]/mL (ref 0.450–4.500)

## 2021-09-07 MED ORDER — TADALAFIL 20 MG PO TABS: 10.0000 mg | ORAL_TABLET | Freq: Every day | ORAL | 0 refills | Status: DC | PRN

## 2021-09-11 ENCOUNTER — Other Ambulatory Visit: Payer: Self-pay | Admitting: Medical

## 2022-04-27 ENCOUNTER — Telehealth: Payer: Self-pay | Admitting: Medical

## 2022-04-27 MED ORDER — VALACYCLOVIR HCL 1 G PO TABS: 1000.0000 mg | ORAL_TABLET | Freq: Two times a day (BID) | ORAL | 1 refills | Status: DC

## 2022-04-27 NOTE — Telephone Encounter (Signed)
Pt called requesting a refill on his valtrex please send to the [id number] - [place], [place]

## 2022-04-27 NOTE — Telephone Encounter (Signed)
Pt called in again, he states he hasn't had it in awhile but needs a refill on it. [facility] last filled it on 11/13/2019.

## 2022-05-23 ENCOUNTER — Encounter: Payer: Self-pay | Admitting: Internal Medicine

## 2022-06-26 ENCOUNTER — Encounter: Payer: Self-pay | Admitting: Internal Medicine

## 2022-07-09 ENCOUNTER — Ambulatory Visit: Payer: BC Managed Care – PPO | Admitting: Medical

## 2022-07-16 ENCOUNTER — Ambulatory Visit: Payer: BC Managed Care – PPO | Admitting: Medical

## 2022-08-13 ENCOUNTER — Ambulatory Visit: Payer: BC Managed Care – PPO | Admitting: Medical

## 2022-08-13 ENCOUNTER — Ambulatory Visit
Admission: RE | Admit: 2022-08-13 | Discharge: 2022-08-13 | Disposition: A | Discharge status: Home / Self Care | Payer: BC Managed Care – PPO | Source: Ambulatory Visit | Attending: Medical | Admitting: Medical

## 2022-08-13 VITALS — BP 110/64 | HR 71 | Wt 248.0 lb

## 2022-08-13 DIAGNOSIS — M4602 Spinal enthesopathy, cervical region: Secondary | ICD-10-CM | POA: Diagnosis not present

## 2022-08-13 DIAGNOSIS — R202 Paresthesia of skin: Secondary | ICD-10-CM | POA: Diagnosis not present

## 2022-08-13 DIAGNOSIS — M25561 Pain in right knee: Secondary | ICD-10-CM

## 2022-08-13 DIAGNOSIS — G8929 Other chronic pain: Secondary | ICD-10-CM | POA: Diagnosis not present

## 2022-08-13 DIAGNOSIS — M25461 Effusion, right knee: Secondary | ICD-10-CM

## 2022-08-13 DIAGNOSIS — M2578 Osteophyte, vertebrae: Secondary | ICD-10-CM | POA: Diagnosis not present

## 2022-08-13 DIAGNOSIS — M792 Neuralgia and neuritis, unspecified: Secondary | ICD-10-CM

## 2022-08-13 DIAGNOSIS — M47812 Spondylosis without myelopathy or radiculopathy, cervical region: Secondary | ICD-10-CM | POA: Diagnosis not present

## 2022-08-13 NOTE — Patient Instructions (Signed)
Please go to [facility] for your neck and knee xray.   Their hours are 8am - 4:30 pm Monday - Friday.  Take your insurance card with you.  [facility] [phone]  [place] [address], [place], [place] [address]  [address] [place], [place] [address]

## 2022-08-13 NOTE — Progress Notes (Signed)
Subjective:  Adam Flowers is a 41 y.o. male who presents for Chief Complaint  Patient presents with   Knee Pain    Right Knee pain x 1 month.      Here for right knee pain.  Having some pain.  If seated for a while, gets to throbbing, gets swollen.  No recent trauma, injury or fall.  Has been playing pickup basketball at times and this can aggravate the knee.  Sometimes it hurts in the knee with stairs if it has been swollen for a while.  He has had problems with the knee for at least the last year or so been getting worse or more frequent problems.  No leg numbness or weakness.  No hip or ankle pain.  Is having some right arm pain and right upper back pain.  Feels like a stinger injury.  Sometimes repeated demonstrating a football needed we will get tingling or numbness or pain in the right arm.  No neck pain.  No other aggravating or relieving factors.    No other c/o.  The following portions of the patient's history were reviewed and updated as appropriate: allergies, current medications, past family history, past medical history, past social history, past surgical history and problem list.  ROS Otherwise as in subjective above  Objective: BP 110/64   Pulse 71   Wt 248 lb (112.5 kg)   BMI 35.58 kg/m   General appearance: alert, no distress, well developed, well nourished Neck: supple, no lymphadenopathy, no thyromegaly, no masses, supple, normal range of motion, nontender Right knee with some mild swelling superior to the patella, tender in the lateral joint line, mild pain with McMurray's test, no obvious laxity, no obvious grinding but he did feel some pain with the McMurray's test and with full extension of the knee, rest the leg unremarkable Tender in the right upper back paraspinal region and supraspinatus region and rhomboid region Right arm with mild tenderness over the [facility] joint, mild pain with apprehension test, otherwise no laxity, no other tenderness, normal range of  motion Rest of upper and lower extremities unremarkable   Assessment: Encounter Diagnoses  Name Primary?   Chronic pain of right knee Yes   Pain and swelling of right knee    Radicular pain in right arm    Paresthesia of right arm      Plan: We discussed his concerns.  We will get some baseline x-rays.  We may likely need to move forward with MRI of the right knee.  We discussed possibilities with the knee which could include meniscus issues, arthritis or other.  We discussed that his symptoms in the arm suggest a radicular cervical spine issue.  Can use anti-inflammatories or Tylenol as needed.  Follow-up pending imaging  [name] was seen today for knee pain.  Diagnoses and all orders for this visit:  Chronic pain of right knee -     DG Knee Complete 4 Views Right; Future  Pain and swelling of right knee -     DG Knee Complete 4 Views Right; Future  Radicular pain in right arm -     DG Cervical Spine Complete; Future  Paresthesia of right arm -     DG Cervical Spine Complete; Future    Follow up: pending xrays

## 2022-08-20 ENCOUNTER — Other Ambulatory Visit: Payer: Self-pay | Admitting: Medical

## 2022-08-20 ENCOUNTER — Telehealth: Payer: Self-pay | Admitting: Family Medicine

## 2022-08-20 DIAGNOSIS — G8929 Other chronic pain: Secondary | ICD-10-CM

## 2022-08-20 NOTE — Telephone Encounter (Signed)
Pt called he does want MRI for knee, he is going to go get a massage for his neck and then decide if PT is wanted.

## 2022-08-22 ENCOUNTER — Telehealth: Payer: Self-pay

## 2022-08-22 NOTE — Telephone Encounter (Signed)
Pt. Called back still has not been called about scheduling MRI, note in the order says they needed to know whether is was with or with out contrast per note from GI. He also wanted to know if he could get a work note to be out tomorrow because he is having a lot of swelling in that knee and wanted to rest it and elevate it. He also wanted to know I the swelling would effect the MRI imaging.

## 2022-08-31 ENCOUNTER — Other Ambulatory Visit: Payer: Self-pay | Admitting: Medical

## 2022-08-31 ENCOUNTER — Ambulatory Visit
Admission: RE | Admit: 2022-08-31 | Discharge: 2022-08-31 | Disposition: A | Discharge status: Home / Self Care | Payer: BC Managed Care – PPO | Source: Ambulatory Visit | Attending: Medical | Admitting: Medical

## 2022-08-31 DIAGNOSIS — M25461 Effusion, right knee: Secondary | ICD-10-CM

## 2022-08-31 DIAGNOSIS — R936 Abnormal findings on diagnostic imaging of limbs: Secondary | ICD-10-CM

## 2022-08-31 DIAGNOSIS — M25561 Pain in right knee: Secondary | ICD-10-CM | POA: Diagnosis not present

## 2022-08-31 DIAGNOSIS — G8929 Other chronic pain: Secondary | ICD-10-CM

## 2022-08-31 NOTE — Progress Notes (Signed)
Results sent through MyChart

## 2022-09-04 ENCOUNTER — Ambulatory Visit (INDEPENDENT_AMBULATORY_CARE_PROVIDER_SITE_OTHER): Payer: BC Managed Care – PPO | Admitting: Orthopaedic Surgery

## 2022-09-04 ENCOUNTER — Encounter: Payer: Self-pay | Admitting: Orthopaedic Surgery

## 2022-09-04 VITALS — BP 122/79 | HR 69 | Ht 70.0 in | Wt 240.0 lb

## 2022-09-04 DIAGNOSIS — M659 Synovitis and tenosynovitis, unspecified: Secondary | ICD-10-CM

## 2022-09-04 DIAGNOSIS — M1712 Unilateral primary osteoarthritis, left knee: Secondary | ICD-10-CM

## 2022-09-04 NOTE — Progress Notes (Signed)
Office Visit Note   Patient: Adam Flowers           Date of Birth: [birth year]           MRN: [id number] Visit Date: 09/04/2022              Requested by: Jac Canavan, PA-C [address] [place],  [place] [address] PCP: Jac Canavan, PA-C   Assessment & Plan: Visit Diagnoses:  1. Unilateral primary osteoarthritis, left knee     Plan: We discussed the osteoarthritis in his knee responded well to the injection with immediate relief.  He had synovitis related to his pre-existing osteoarthritis.  We discussed activity modification.  He will continue to work on some gradual weight loss and can do activities such as biking or elliptical for cardiovascular fitness which would not bother his knee is much as jumping cutting etc.  He can follow-up if he has increased problems.  Follow-Up Instructions: Return if symptoms worsen or fail to improve.   Orders:  Orders Placed This Encounter  Procedures   Large Joint Inj: R knee   No orders of the defined types were placed in this encounter.     Procedures: Large Joint Inj: R knee on 09/04/2022 11:22 AM Indications: pain and joint swelling Details: 22 G 1.5 in needle, anterolateral approach  Arthrogram: No  Medications: 40 mg methylPREDNISolone acetate 40 MG/ML; 0.5 mL lidocaine 1 %; 4 mL bupivacaine 0.25 % Outcome: tolerated well, no immediate complications Procedure, treatment alternatives, risks and benefits explained, specific risks discussed. Consent was given by the patient. Immediately prior to procedure a time out was called to verify the correct patient, procedure, equipment, support staff and site/side marked as required. Patient was prepped and draped in the usual sterile fashion.       Clinical Data: No additional findings.   Subjective: Chief Complaint  Patient presents with   Right Knee - Pain    HPI 41 year old male with right knee pain and swelling.  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  Afterwards he had pain ,swelling ,difficulty walking.  Stiffness and pain with bending.  MRI showed some fraying lateral edge of the meniscus.  Mild tricompartmental degenerative changes with lateral high-grade partial cartilage loss, medial full-thickness cartilage loss anteriorly in the weightbearing portion of the medial femoral condyle.  Scan date 08/31/2022.  Review of Systems BMI 34 impaired glucose.  No locking.  Denies associated back pain.  All systems noncontributory HPI.   Objective: Vital Signs: BP 122/79   Pulse 69   Ht 5\' 10"  (1.778 m)   Wt 240 lb (108.9 kg)   BMI 34.44 kg/m   Physical Exam Constitutional:      Appearance: He is well-developed.  HENT:     Head: Normocephalic and atraumatic.     Right Ear: External ear normal.     Left Ear: External ear normal.  Eyes:     Pupils: Pupils are equal, round, and reactive to light.  Neck:     Thyroid: No thyromegaly.     Trachea: No tracheal deviation.  Cardiovascular:     Rate and Rhythm: Normal rate.  Pulmonary:     Effort: Pulmonary effort is normal.     Breath sounds: No wheezing.  Abdominal:     General: Bowel sounds are normal.     Palpations: Abdomen is  soft.  Musculoskeletal:     Cervical back: Neck supple.  Skin:    General: Skin is warm and dry.     Capillary Refill: Capillary refill takes less than 2 seconds.  Neurological:     Mental Status: He is alert and oriented to person, place, and time.  Psychiatric:        Behavior: Behavior normal.        Thought Content: Thought content normal.        Judgment: Judgment normal.     Ortho Exam 2+ knee effusion.  Some medial lateral joint line tenderness crepitus with knee range of motion ACL PCL is normal negative logroll hips no sciatic notch tenderness negative straight leg raising 90 degrees.  Positive patellar grind test normal patellar  tracking.  Specialty Comments:  No specialty comments available.  Imaging: No results found.   PMFS History: Patient Active Problem List   Diagnosis Date Noted   Seasonal allergic rhinitis due to pollen 01/17/2021   Allergic conjunctivitis of both eyes 01/17/2021   Encounter for health maintenance examination in adult 08/26/2020   Vaccine counseling 08/26/2020   Varicose veins of lower extremity 08/26/2020   Pain and swelling of right knee 07/18/2020   Acute pain of right knee 07/18/2020   Varicose veins of right lower extremity 07/18/2020   Influenza vaccination declined 07/04/2018   Gastroenteritis 07/04/2018   Diarrhea 07/04/2018   Elevated blood-pressure reading without diagnosis of hypertension 03/25/2018   Herpetic lesion 03/25/2018   Impaired fasting blood sugar 03/24/2018   Screening for lipid disorders 11/23/2015   Varicose veins of lower extremities with ulcer ([facility]) 11/23/2015   Chronic venous insufficiency 11/23/2015   Venous stasis dermatitis of right lower extremity 11/23/2015   Obesity 11/23/2015   Genital herpes 07/17/2011   Numbness of toes 07/17/2011   Neoplasm of skin of lower leg 07/17/2011   Past Medical History:  Diagnosis Date   Allergy    Genital herpes    Varicose vein    Wears glasses     Family History  Problem Relation Age of Onset   Kidney disease Mother    Diabetes Father    Cancer Neg Hx    Heart disease Neg Hx    Hypertension Neg Hx    Hyperlipidemia Neg Hx    Stroke Neg Hx     Past Surgical History:  Procedure Laterality Date   ARTHROSCOPY KNEE W/ DRILLING     Social History   Occupational History   Not on file  Tobacco Use   Smoking status: Never   Smokeless tobacco: Never  Substance and Sexual Activity   Alcohol use: Yes    Comment: once monthly, minimal   Drug use: Not Currently    Types: Marijuana   Sexual activity: Not on file

## 2022-09-05 DIAGNOSIS — M659 Synovitis and tenosynovitis, unspecified: Secondary | ICD-10-CM | POA: Insufficient documentation

## 2022-09-05 MED ORDER — BUPIVACAINE HCL 0.25 % IJ SOLN
4.0000 mL | INTRAMUSCULAR | Status: AC | PRN
  Administered 2022-09-04: 4 mL via INTRA_ARTICULAR

## 2022-09-05 MED ORDER — METHYLPREDNISOLONE ACETATE 40 MG/ML IJ SUSP
40.0000 mg | INTRAMUSCULAR | Status: AC | PRN
  Administered 2022-09-04: 40 mg via INTRA_ARTICULAR

## 2022-09-05 MED ORDER — LIDOCAINE HCL 1 % IJ SOLN
0.5000 mL | INTRAMUSCULAR | Status: AC | PRN
  Administered 2022-09-04: .5 mL

## 2022-09-13 ENCOUNTER — Encounter: Payer: Self-pay | Admitting: Medical

## 2022-09-13 ENCOUNTER — Ambulatory Visit: Payer: BC Managed Care – PPO | Admitting: Medical

## 2022-09-13 VITALS — BP 110/88 | HR 77 | Ht 70.0 in | Wt 245.4 lb

## 2022-09-13 DIAGNOSIS — Z2821 Immunization not carried out because of patient refusal: Secondary | ICD-10-CM | POA: Diagnosis not present

## 2022-09-13 DIAGNOSIS — Z7185 Encounter for immunization safety counseling: Secondary | ICD-10-CM

## 2022-09-13 DIAGNOSIS — Z0000 Encounter for general adult medical examination without abnormal findings: Secondary | ICD-10-CM

## 2022-09-13 DIAGNOSIS — Z13220 Encounter for screening for lipoid disorders: Secondary | ICD-10-CM | POA: Diagnosis not present

## 2022-09-13 DIAGNOSIS — I8390 Asymptomatic varicose veins of unspecified lower extremity: Secondary | ICD-10-CM

## 2022-09-13 DIAGNOSIS — R030 Elevated blood-pressure reading, without diagnosis of hypertension: Secondary | ICD-10-CM

## 2022-09-13 DIAGNOSIS — Z23 Encounter for immunization: Secondary | ICD-10-CM

## 2022-09-13 LAB — POCT URINALYSIS DIP (PROADVANTAGE DEVICE)
Bilirubin, UA: NEGATIVE
Blood, UA: NEGATIVE
Glucose, UA: NEGATIVE mg/dL
Ketones, POC UA: NEGATIVE mg/dL
Leukocytes, UA: NEGATIVE
Nitrite, UA: POSITIVE — AB
Protein Ur, POC: NEGATIVE mg/dL
Specific Gravity, Urine: 1.02
Urobilinogen, Ur: 0.2
pH, UA: 6 (ref 5.0–8.0)

## 2022-09-13 NOTE — Patient Instructions (Signed)
Today you had a preventative care visit or wellness visit.    Topics today may have included healthy lifestyle, diet, exercise, preventative care, vaccinations, sick and well care, proper use of emergency dept and after hours care, as well as other concerns.     Recommendations: Continue to return yearly for your annual wellness and preventative care visits.  This gives [place] a chance to discuss healthy lifestyle, exercise, vaccinations, review your chart record, and perform screenings where appropriate.  I recommend you see your eye doctor yearly for routine vision care.  I recommend you see your dentist yearly for routine dental care including hygiene visits twice yearly.   Vaccination recommendations were reviewed Immunization History  Administered Date(s) Administered   Influenza Split 06/11/2012   Moderna SARS-COV2 Booster Vaccination 08/30/2020   Moderna Sars-Covid-2 Vaccination 11/23/2019, 12/21/2019   PPD Test 06/03/2012   Tdap 06/11/2012    Counseled on the Td (tetanus, diptheria) vaccine.  Vaccine information sheet given. Td vaccine given after consent obtained.  Declines flu shot   Screening for cancer: Colon cancer screening:  Age 40  Cancer screening You should do a monthly self testicular exam   We discussed PSA, prostate exam, and prostate cancer screening risks/benefits.   Age 41yo  Skin cancer screening: Check your skin regularly for new changes, growing lesions, or other lesions of concern Come in for evaluation if you have skin lesions of concern.  Lung cancer screening: If you have a greater than 30 pack year history of tobacco use, then you qualify for lung cancer screening with a chest CT scan  We currently don't have screenings for other cancers besides breast, cervical, colon, and lung cancers.  If you have a strong family history of cancer or have other cancer screening concerns, please let me know.    Bone health: Get at least 150 minutes of  aerobic exercise weekly Get weight bearing exercise at least once weekly   Heart health: Get at least 150 minutes of aerobic exercise weekly Limit alcohol It is important to maintain a healthy blood pressure and healthy cholesterol numbers   Separate significant issues discussed: Elevated blood pressure - monitor occasionally.   Goal 120/70. Borderline is 130/80, high blood pressure threshold is 140/90  Consider using OTC Fish Oil supplement and or OTC Glucosamine Chondroitin supplement for knees.   When flare up of pain or swelling use ice /cold therapy, compression sleeve, elevation, relative rest.   Follow up with orthopedics as needed  Consider Whole 30 eating plan or other healthy  low sugar diet.

## 2022-09-13 NOTE — Progress Notes (Signed)
Subjective:   HPI  Adam Flowers is a 41 y.o. male who presents for Chief Complaint  Patient presents with   Annual Exam    Patient Care Team: Buffey Zabinski, Leward Quan as PCP - General Sees dentist Sees eye doctor  Concerns: Saw ortho recently about knee, had steroid injection right knee.   Seeing some improvement.  Reviewed their medical, surgical, family, social, medication, and allergy history and updated chart as appropriate.  Past Medical History:  Diagnosis Date   Allergy    Genital herpes    Varicose vein    Wears glasses     Past Surgical History:  Procedure Laterality Date   ARTHROSCOPY KNEE W/ DRILLING Left     Family History  Problem Relation Age of Onset   Kidney disease Mother    Diabetes Father    Cancer Neg Hx    Heart disease Neg Hx    Hypertension Neg Hx    Hyperlipidemia Neg Hx    Stroke Neg Hx     No current outpatient medications on file.  No Known Allergies     Review of Systems Constitutional: -fever, -chills, -sweats, -unexpected weight change, -decreased appetite, -fatigue Allergy: -sneezing, -itching, -congestion Dermatology: -changing moles, --rash, -lumps ENT: -runny nose, -ear pain, -sore throat, -hoarseness, -sinus pain, -teeth pain, - ringing in ears, -hearing loss, -nosebleeds Cardiology: -chest pain, -palpitations, -swelling, -difficulty breathing when lying flat, -waking up short of breath Respiratory: -cough, -shortness of breath, -difficulty breathing with exercise or exertion, -wheezing, -coughing up blood Gastroenterology: -abdominal pain, -nausea, -vomiting, -diarrhea, -constipation, -blood in stool, -changes in bowel movement, -difficulty swallowing or eating Hematology: -bleeding, -bruising  Musculoskeletal: +joint aches, -muscle aches, -joint swelling, -back pain, -neck pain, -cramping, -changes in gait Ophthalmology: denies vision changes, eye redness, itching, discharge Urology: -burning with urination,  -difficulty urinating, -blood in urine, -urinary frequency, -urgency, -incontinence Neurology: -headache, -weakness, -tingling, -numbness, -memory loss, -falls, -dizziness Psychology: -depressed mood, -agitation, -sleep problems Male GU: no testicular mass, pain, no lymph nodes swollen, no swelling, no rash.     Objective:  BP 110/88   Pulse 77   Ht '5\' 10"'$  (1.778 m)   Wt 245 lb 6.4 oz (111.3 kg)   SpO2 97%   BMI 35.21 kg/m   General appearance: alert, no distress, WD/WN, African American male Skin: unremarkable HEENT: normocephalic, conjunctiva/corneas normal, sclerae anicteric, PERRLA, EOMi, nares patent, no discharge or erythema, pharynx normal Oral cavity: MMM, tongue normal, teeth normal Neck: supple, no lymphadenopathy, no thyromegaly, no masses, normal ROM, no bruits Chest: non tender, normal shape and expansion Heart: RRR, normal S1, S2, no murmurs Lungs: CTA bilaterally, no wheezes, rhonchi, or rales Abdomen: +bs, soft, non tender, non distended, no masses, no hepatomegaly, no splenomegaly, no bruits Back: non tender, normal ROM, no scoliosis Musculoskeletal: upper extremities non tender, no obvious deformity, normal ROM throughout, lower extremities non tender, no obvious deformity, normal ROM throughout Extremities: no edema, no cyanosis, no clubbing Pulses: 2+ symmetric, upper and lower extremities, normal cap refill Neurological: alert, oriented x 3, CN2-12 intact, strength normal upper extremities and lower extremities, sensation normal throughout, DTRs 2+ throughout, no cerebellar signs, gait normal Psychiatric: normal affect, behavior normal, pleasant  GU: deferred Rectal:deferred   Assessment and Plan :   Encounter Diagnoses  Name Primary?   Need for Td vaccine Yes   Encounter for health maintenance examination in adult    Vaccine counseling    Screening for lipid disorders    Influenza vaccination declined  Elevated blood-pressure reading without  diagnosis of hypertension    Varicose veins of lower extremity, unspecified laterality, unspecified whether complicated     Today you had a preventative care visit or wellness visit.    Topics today may have included healthy lifestyle, diet, exercise, preventative care, vaccinations, sick and well care, proper use of emergency dept and after hours care, as well as other concerns.     Recommendations: Continue to return yearly for your annual wellness and preventative care visits.  This gives [place] a chance to discuss healthy lifestyle, exercise, vaccinations, review your chart record, and perform screenings where appropriate.  I recommend you see your eye doctor yearly for routine vision care.  I recommend you see your dentist yearly for routine dental care including hygiene visits twice yearly.   Vaccination recommendations were reviewed Immunization History  Administered Date(s) Administered   Influenza Split 06/11/2012   Moderna SARS-COV2 Booster Vaccination 08/30/2020   Moderna Sars-Covid-2 Vaccination 11/23/2019, 12/21/2019   PPD Test 06/03/2012   Tdap 06/11/2012, 09/13/2022    Counseled on the Td (tetanus, diptheria) vaccine.  Vaccine information sheet given. Td vaccine given after consent obtained.  Declines flu shot   Screening for cancer: Colon cancer screening:  Age 23  Cancer screening You should do a monthly self testicular exam   We discussed PSA, prostate exam, and prostate cancer screening risks/benefits.   Age 55yo  Skin cancer screening: Check your skin regularly for new changes, growing lesions, or other lesions of concern Come in for evaluation if you have skin lesions of concern.  Lung cancer screening: If you have a greater than 30 pack year history of tobacco use, then you qualify for lung cancer screening with a chest CT scan  We currently don't have screenings for other cancers besides breast, cervical, colon, and lung cancers.  If you have a strong  family history of cancer or have other cancer screening concerns, please let me know.    Bone health: Get at least 150 minutes of aerobic exercise weekly Get weight bearing exercise at least once weekly   Heart health: Get at least 150 minutes of aerobic exercise weekly Limit alcohol It is important to maintain a healthy blood pressure and healthy cholesterol numbers   Separate significant issues discussed: Elevated blood pressure - monitor occasionally.   Goal 120/70. Borderline is 130/80, high blood pressure threshold is 140/90  Consider using OTC Fish Oil supplement and or OTC Glucosamine Chondroitin supplement for knees.   When flare up of pain or swelling use ice /cold therapy, compression sleeve, elevation, relative rest.   Follow up with orthopedics as needed  Consider Whole 30 eating plan or other healthy  low sugar diet.    Davarion was seen today for annual exam.  Diagnoses and all orders for this visit:  Need for Td vaccine -     Tdap vaccine greater than or equal to 7yo IM  Encounter for health maintenance examination in adult -     Comprehensive metabolic panel -     CBC -     Lipid panel -     TSH -     POCT Urinalysis DIP (Proadvantage Device)  Vaccine counseling  Screening for lipid disorders -     Lipid panel  Influenza vaccination declined  Elevated blood-pressure reading without diagnosis of hypertension  Varicose veins of lower extremity, unspecified laterality, unspecified whether complicated    Follow-up pending labs, yearly for physical

## 2022-09-14 LAB — COMPREHENSIVE METABOLIC PANEL
ALT: 30 IU/L (ref 0–44)
AST: 15 IU/L (ref 0–40)
Albumin/Globulin Ratio: 1.7 (ref 1.2–2.2)
Albumin: 5.1 g/dL (ref 4.1–5.1)
Alkaline Phosphatase: 69 IU/L (ref 44–121)
BUN/Creatinine Ratio: 11 (ref 9–20)
BUN: 15 mg/dL (ref 6–24)
Bilirubin Total: 0.5 mg/dL (ref 0.0–1.2)
CO2: 24 mmol/L (ref 20–29)
Calcium: 9.9 mg/dL (ref 8.7–10.2)
Chloride: 101 mmol/L (ref 96–106)
Creatinine, Ser: 1.35 mg/dL — ABNORMAL HIGH (ref 0.76–1.27)
Globulin, Total: 3 g/dL (ref 1.5–4.5)
Glucose: 84 mg/dL (ref 70–99)
Potassium: 4.9 mmol/L (ref 3.5–5.2)
Sodium: 140 mmol/L (ref 134–144)
Total Protein: 8.1 g/dL (ref 6.0–8.5)
eGFR: 68 mL/min/{1.73_m2} (ref >59)

## 2022-09-14 LAB — CBC
Hematocrit: 45.9 % (ref 37.5–51.0)
Hemoglobin: 16.1 g/dL (ref 13.0–17.7)
MCH: 28.7 pg (ref 26.6–33.0)
MCHC: 35.1 g/dL (ref 31.5–35.7)
MCV: 82 fL (ref 79–97)
Platelets: 241 10*3/uL (ref 150–450)
RBC: 5.61 x10E6/uL (ref 4.14–5.80)
RDW: 13.1 % (ref 11.6–15.4)
WBC: 5.9 10*3/uL (ref 3.4–10.8)

## 2022-09-14 LAB — LIPID PANEL
Chol/HDL Ratio: 2.6 ratio (ref 0.0–5.0)
Cholesterol, Total: 170 mg/dL (ref 100–199)
HDL: 65 mg/dL (ref >39)
LDL Chol Calc (NIH): 91 mg/dL (ref 0–99)
Triglycerides: 72 mg/dL (ref 0–149)
VLDL Cholesterol Cal: 14 mg/dL (ref 5–40)

## 2022-09-14 LAB — TSH: TSH: 2.56 u[IU]/mL (ref 0.450–4.500)

## 2022-09-14 NOTE — Progress Notes (Signed)
Results sent through MyChart

## 2022-09-18 ENCOUNTER — Telehealth: Payer: Self-pay | Admitting: Internal Medicine

## 2022-09-18 NOTE — Telephone Encounter (Signed)
Pt states he saw his lab results and he is having some lower back  pain at kidney area and wants to know does he need an anitbiotic. No other symptoms of urinary issues or feeling bad. [facility]

## 2022-09-19 ENCOUNTER — Other Ambulatory Visit: Payer: Self-pay | Admitting: Medical

## 2022-09-19 MED ORDER — SULFAMETHOXAZOLE-TRIMETHOPRIM 800-160 MG PO TABS: 1.0000 | ORAL_TABLET | Freq: Two times a day (BID) | ORAL | 0 refills | Status: DC

## 2022-09-19 NOTE — Telephone Encounter (Signed)
Pt was notified.  

## 2022-10-22 ENCOUNTER — Ambulatory Visit (INDEPENDENT_AMBULATORY_CARE_PROVIDER_SITE_OTHER): Payer: BC Managed Care – PPO | Admitting: Orthopaedic Surgery

## 2022-10-22 ENCOUNTER — Encounter: Payer: Self-pay | Admitting: Orthopaedic Surgery

## 2022-10-22 ENCOUNTER — Ambulatory Visit: Payer: BC Managed Care – PPO | Admitting: Orthopaedic Surgery

## 2022-10-22 VITALS — BP 124/86 | HR 76 | Ht 70.0 in | Wt 240.0 lb

## 2022-10-22 DIAGNOSIS — M1711 Unilateral primary osteoarthritis, right knee: Secondary | ICD-10-CM | POA: Diagnosis not present

## 2022-10-23 NOTE — Progress Notes (Signed)
Office Visit Note   Patient: Adam Flowers           Date of Birth: [birth year]           MRN: [id number] Visit Date: 10/22/2022              Requested by: Carlena Hurl, PA-C [address] [place],  [place] [address] PCP: Carlena Hurl, PA-C   Assessment & Plan: Visit Diagnoses:  1. Unilateral primary osteoarthritis, right knee     Plan: We discussed the osteoarthritis.  He has some area of full-thickness cartilage loss but is too young for total knee arthroplasty.  1 option might be medial compartment uni but he still has some lateral compartment partial thickness loss which is likely to progress with time.  Will set him up for some physical therapy.  He will continue anti-only if approved by his PCP since his creatinine is elevated at 1.35 with normal BUN.  2 years ago creatinine was 1.41.  We discussed problems with anti-inflammatories and potential for further kidney damage and what this could lead to of his kidney got worse.  Will set him up for some physical therapy for his right knee.  He can follow-up with me in a couple months.  Follow-Up Instructions: No follow-ups on file.   Orders:  Orders Placed This Encounter  Procedures   Ambulatory referral to Physical Therapy   No orders of the defined types were placed in this encounter.     Procedures: No procedures performed   Clinical Data: No additional findings.   Subjective: Chief Complaint  Patient presents with   Right Knee - Pain    HPI 42 year old male returns with ongoing right knee pain and swelling.  Injection cortisone only lasted a couple weeks with 50% relief.  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  Not he is back in school walking a lot and on his feet he has had increased pain and swelling.  Previous MRI showed some fraying of the edge of the lateral meniscus no discrete meniscal tear and  tricompartment degenerative changes with some areas of full-thickness cartilage wear over the medial femoral condyle and weightbearing position with his knee in extension.  Review of Systems   Objective: Vital Signs: BP 124/86   Pulse 76   Ht '5\' 10"'$  (1.778 m)   Wt 240 lb (108.9 kg)   BMI 34.44 kg/m   Physical Exam  Ortho Exam  Specialty Comments:  No specialty comments available.  Imaging: No results found.   PMFS History: Patient Active Problem List   Diagnosis Date Noted   Unilateral primary osteoarthritis, right knee 10/22/2022   Synovitis of right knee 09/05/2022   Seasonal allergic rhinitis due to pollen 01/17/2021   Allergic conjunctivitis of both eyes 01/17/2021   Encounter for health maintenance examination in adult 08/26/2020   Vaccine counseling 08/26/2020   Varicose veins of lower extremity 08/26/2020   Pain and swelling of right knee 07/18/2020   Varicose veins of right lower extremity 07/18/2020   Influenza vaccination declined 07/04/2018   Elevated blood-pressure reading without diagnosis of hypertension 03/25/2018   Herpetic lesion 03/25/2018   Screening for lipid disorders 11/23/2015   Varicose veins of lower extremities with ulcer ([facility] [facility]) 11/23/2015   Chronic venous insufficiency 11/23/2015   Venous stasis dermatitis of right lower extremity 11/23/2015   Genital herpes 07/17/2011  Neoplasm of skin of lower leg 07/17/2011   Past Medical History:  Diagnosis Date   Allergy    Genital herpes    Varicose vein    Wears glasses     Family History  Problem Relation Age of Onset   Kidney disease Mother    Diabetes Father    Cancer Neg Hx    Heart disease Neg Hx    Hypertension Neg Hx    Hyperlipidemia Neg Hx    Stroke Neg Hx     Past Surgical History:  Procedure Laterality Date   ARTHROSCOPY KNEE W/ DRILLING Left    Social History   Occupational History   Not on file  Tobacco Use   Smoking status: Never   Smokeless tobacco: Never   Substance and Sexual Activity   Alcohol use: Yes    Comment: once monthly, minimal   Drug use: Not Currently    Types: Marijuana   Sexual activity: Not on file

## 2022-10-29 NOTE — Therapy (Signed)
OUTPATIENT PHYSICAL THERAPY EVALUATION   Patient Name: Maggie Krippner MRN: [id number] DOB:02/11/1981, 42 y.o., male Today's Date: 10/30/2022  END OF SESSION:  PT End of Session - 10/30/22 0757     Visit Number 1    Number of Visits 20    Date for PT Re-Evaluation 01/08/23    Authorization Type [employer] 50%  - 30 visits    Authorization - Visit Number 1    Authorization - Number of Visits 30    PT Start Time 0803    PT Stop Time 0828    PT Time Calculation (min) 25 min    Activity Tolerance Patient tolerated treatment well    Behavior During Therapy [facility] for tasks assessed/performed             Past Medical History:  Diagnosis Date   Allergy    Genital herpes    Varicose vein    Wears glasses    Past Surgical History:  Procedure Laterality Date   ARTHROSCOPY KNEE W/ DRILLING Left    Patient Active Problem List   Diagnosis Date Noted   Unilateral primary osteoarthritis, right knee 10/22/2022   Synovitis of right knee 09/05/2022   Seasonal allergic rhinitis due to pollen 01/17/2021   Allergic conjunctivitis of both eyes 01/17/2021   Encounter for health maintenance examination in adult 08/26/2020   Vaccine counseling 08/26/2020   Varicose veins of lower extremity 08/26/2020   Pain and swelling of right knee 07/18/2020   Varicose veins of right lower extremity 07/18/2020   Influenza vaccination declined 07/04/2018   Elevated blood-pressure reading without diagnosis of hypertension 03/25/2018   Herpetic lesion 03/25/2018   Screening for lipid disorders 11/23/2015   Varicose veins of lower extremities with ulcer ([place]) 11/23/2015   Chronic venous insufficiency 11/23/2015   Venous stasis dermatitis of right lower extremity 11/23/2015   Genital herpes 07/17/2011   Neoplasm of skin of lower leg 07/17/2011    PCP: Jacklynn Barnacle  REFERRING PROVIDER: Marybelle Killings, MD  REFERRING DIAG: M17.11 (ICD-10-CM) - Unilateral primary osteoarthritis, right knee  THERAPY  DIAG:  Chronic pain of right knee  Muscle weakness (generalized)  Difficulty in walking, not elsewhere classified  Localized edema  Rationale for Evaluation and Treatment: Rehabilitation  ONSET DATE: "for months" but worsened in Oct 2023  SUBJECTIVE:   SUBJECTIVE STATEMENT: He indicated having swelling "for months" at times.  He indicated stiffness in knee at times and tenderness inside and outside.  He then saw MD and had MRI performed showing arthritis.   He indicated he was playing basketball in Oct that worsened symptoms.   PERTINENT HISTORY: Right knee OA medial compartment  PAIN:  NPRS scale: at worst 4-5/10, at best 0/10 Pain location: Rt knee Pain description: stiffness, ache Aggravating factors: stairs, basketball, prolonged standing/walking Relieving factors: icing at times, knee brace at times  PRECAUTIONS: None  WEIGHT BEARING RESTRICTIONS: No  FALLS:  Has patient fallen in last 6 months? No  LIVING ENVIRONMENT: Lives in: House/apartment Stairs: flight of stairs to bedroom  OCCUPATION: Works as principal with standing/walking  PLOF: Independent, recreational basketball(1x/week), cardio machines at times  PATIENT GOALS: Reduce pain, swelling   OBJECTIVE:   PATIENT SURVEYS:  10/30/2022 FOTO intake: 54   predicted:  71  COGNITION: 10/30/2022 Overall cognitive status: WFL    SENSATION: 10/30/2022 Unremarkable  EDEMA:  10/30/2022 Visual edema noted Rt knee compared to lt.   MUSCLE LENGTH: 10/30/2022 No specific testing today  POSTURE:  10/30/2022 Unremarkable to  condition  PALPATION: 10/30/2022 Mild tenderness joint line Rt knee anteriorly   LOWER EXTREMITY ROM:   ROM Right 10/30/2022 Left 10/30/2022  Hip flexion    Hip extension    Hip abduction    Hip adduction    Hip internal rotation    Hip external rotation    Knee flexion 125 with pain in supine heel slide AROM 130 with pain in supine heel slide AROM  Knee extension 0 0   Ankle dorsiflexion    Ankle plantarflexion    Ankle inversion    Ankle eversion     (Blank rows = not tested)  LOWER EXTREMITY MMT:  MMT Right 10/30/2022 Measured seated Left 10/30/2022 Measured seated  Hip flexion 5/5 5/5  Hip extension    Hip abduction    Hip adduction    Hip internal rotation    Hip external rotation    Knee flexion 5/5 5/5  Knee extension 5/5 62.4, 68 lbs With pain 5/5 70, 70.3 lbs  Ankle dorsiflexion    Ankle plantarflexion    Ankle inversion    Ankle eversion     (Blank rows = not tested)  LOWER EXTREMITY SPECIAL TESTS:  10/30/2022 No specific testing  FUNCTIONAL TESTS:  10/30/2022 18 inch chair transfer: 18 inch chair s UE with mild soreness with movement Lt SLS: 30 seconds unremarkable Rt SLS: 30 seconds c increased aberrant movement, mild symptoms c duration  GAIT: 10/30/2022 Independent ambulation c mild deviation in stance phase on Rt vs. Lt.    TODAY'S TREATMENT                                                                          DATE:10/30/2022 Therex:    HEP instruction/performance c cues for techniques, handout provided.  Trial set performed of each for comprehension and symptom assessment.  See below for exercise list  PATIENT EDUCATION:  10/30/2022 Education details: HEP, POC Person educated: Patient Education method: Explanation, Demonstration, Verbal cues, and Handouts Education comprehension: verbalized understanding, returned demonstration, and verbal cues required  HOME EXERCISE PROGRAM: Access Code: [email or web address] URL: [email or web address] Date: 10/30/2022 Prepared by: Scot Jun  Exercises - Seated Long Arc Quad  - 3-5 x daily - 7 x weekly - 1-2 sets - 10 reps - 2 hold - Supine Heel Slide  - 3-5 x daily - 7 x weekly - 1-2 sets - 10 reps - 2 hold - Seated Quad Set  - 3-5 x daily - 7 x weekly - 1 sets - 10 reps - 5 hold - Seated Straight Leg Heel Taps  - 1-2 x daily - 7 x weekly - 3 sets - 10  reps  ASSESSMENT:  CLINICAL IMPRESSION: Patient is a 42 y.o. who comes to clinic with complaints of Rt knee pain with mobility, strength and movement coordination deficits that impair their ability to perform usual daily and recreational functional activities without increase difficulty/symptoms at this time.  Patient to benefit from skilled PT services to address impairments and limitations to improve to previous level of function without restriction secondary to condition.   OBJECTIVE IMPAIRMENTS: Abnormal gait, decreased activity tolerance, decreased balance, decreased coordination, decreased endurance, decreased mobility, difficulty walking, decreased ROM, decreased strength, increased edema, impaired perceived  functional ability, impaired flexibility, improper body mechanics, and pain.   ACTIVITY LIMITATIONS: lifting, bending, sitting, standing, squatting, stairs, transfers, and locomotion level  PARTICIPATION LIMITATIONS: interpersonal relationship, community activity, occupation, and exercise  PERSONAL FACTORS: No specific factors are also affecting patient's functional outcome.   REHAB POTENTIAL: Good  CLINICAL DECISION MAKING: Stable/uncomplicated  EVALUATION COMPLEXITY: Low   GOALS: Goals reviewed with patient? Yes  SHORT TERM GOALS: (target date for Short term goals are 3 weeks 11/20/2022)   1.  Patient will demonstrate independent use of home exercise program to maintain progress from in clinic treatments.  Goal status: New  LONG TERM GOALS: (target dates for all long term goals are 10 weeks  01/08/2023 )   1. Patient will demonstrate/report pain at worst less than or equal to 2/10 to facilitate minimal limitation in daily activity secondary to pain symptoms.  Goal status: New   2. Patient will demonstrate independent use of home exercise program to facilitate ability to maintain/progress functional gains from skilled physical therapy services.  Goal status: New   3.  Patient will demonstrate FOTO outcome > or = 71 % to indicate reduced disability due to condition.  Goal status: New   4.  Patient will demonstrate Rt knee extension dynamometry improvement > = 10 lbs to faciltiate usual transfers, stairs, squatting at PLOF for daily life.   Goal status: New   5.  Patient will demonstrate ascending/descending stairs reciprocal without UE assist or deviation for household and school navigation. Goal status: New   6.  Patient will demonstrate bilateral SLS 30 seconds s aberrant movement/symptoms for stability in activity.  Goal status: New    PLAN:  PT FREQUENCY: 1-2x/week  PT DURATION: 10 weeks  PLANNED INTERVENTIONS: Therapeutic exercises, Therapeutic activity, Neuro Muscular re-education, Balance training, Gait training, Patient/Family education, Joint mobilization, Stair training, DME instructions, Dry Needling, Electrical stimulation, Traction, Cryotherapy, vasopneumatic deviceMoist heat, Taping, Ultrasound, Ionotophoresis 80m/ml Dexamethasone, and Manual therapy.  All included unless contraindicated  PLAN FOR NEXT SESSION: Review HEP knowledge/results.  Leg press, knee extension pain free for gym inclusion.    MScot Jun PT, DPT, OCS, ATC 10/30/22  8:33 AM

## 2022-10-30 ENCOUNTER — Ambulatory Visit (INDEPENDENT_AMBULATORY_CARE_PROVIDER_SITE_OTHER): Payer: BC Managed Care – PPO | Admitting: Rehabilitative and Restorative Service Providers"

## 2022-10-30 ENCOUNTER — Other Ambulatory Visit: Payer: Self-pay

## 2022-10-30 ENCOUNTER — Encounter: Payer: Self-pay | Admitting: Rehabilitative and Restorative Service Providers"

## 2022-10-30 DIAGNOSIS — M25561 Pain in right knee: Secondary | ICD-10-CM

## 2022-10-30 DIAGNOSIS — R600 Localized edema: Secondary | ICD-10-CM

## 2022-10-30 DIAGNOSIS — M6281 Muscle weakness (generalized): Secondary | ICD-10-CM

## 2022-10-30 DIAGNOSIS — R262 Difficulty in walking, not elsewhere classified: Secondary | ICD-10-CM

## 2022-10-30 DIAGNOSIS — G8929 Other chronic pain: Secondary | ICD-10-CM

## 2022-11-09 ENCOUNTER — Encounter: Payer: Self-pay | Admitting: Rehabilitative and Restorative Service Providers"

## 2022-11-09 ENCOUNTER — Ambulatory Visit: Payer: BC Managed Care – PPO | Admitting: Rehabilitative and Restorative Service Providers"

## 2022-11-09 DIAGNOSIS — M25561 Pain in right knee: Secondary | ICD-10-CM

## 2022-11-09 DIAGNOSIS — R262 Difficulty in walking, not elsewhere classified: Secondary | ICD-10-CM

## 2022-11-09 DIAGNOSIS — M6281 Muscle weakness (generalized): Secondary | ICD-10-CM | POA: Diagnosis not present

## 2022-11-09 DIAGNOSIS — G8929 Other chronic pain: Secondary | ICD-10-CM

## 2022-11-09 DIAGNOSIS — R600 Localized edema: Secondary | ICD-10-CM

## 2022-11-09 NOTE — Therapy (Addendum)
OUTPATIENT PHYSICAL THERAPY TREATMENT   /DISCHARGE   Patient Name: Adam Flowers MRN: [id number] DOB:[birth year], 42 y.o., male Today's Date: 11/09/2022  END OF SESSION:  PT End of Session - 11/09/22 0815     Visit Number 2    Number of Visits 20    Date for PT Re-Evaluation 01/08/23    Authorization Type [employer] 50%  - 30 visits    Authorization - Visit Number 2    Authorization - Number of Visits 30    PT Start Time 0756    PT Stop Time 0835    PT Time Calculation (min) 39 min    Activity Tolerance Patient tolerated treatment well    Behavior During Therapy [facility] for tasks assessed/performed              Past Medical History:  Diagnosis Date   Allergy    Genital herpes    Varicose vein    Wears glasses    Past Surgical History:  Procedure Laterality Date   ARTHROSCOPY KNEE W/ DRILLING Left    Patient Active Problem List   Diagnosis Date Noted   Unilateral primary osteoarthritis, right knee 10/22/2022   Synovitis of right knee 09/05/2022   Seasonal allergic rhinitis due to pollen 01/17/2021   Allergic conjunctivitis of both eyes 01/17/2021   Encounter for health maintenance examination in adult 08/26/2020   Vaccine counseling 08/26/2020   Varicose veins of lower extremity 08/26/2020   Pain and swelling of right knee 07/18/2020   Varicose veins of right lower extremity 07/18/2020   Influenza vaccination declined 07/04/2018   Elevated blood-pressure reading without diagnosis of hypertension 03/25/2018   Herpetic lesion 03/25/2018   Screening for lipid disorders 11/23/2015   Varicose veins of lower extremities with ulcer ([facility]) 11/23/2015   Chronic venous insufficiency 11/23/2015   Venous stasis dermatitis of right lower extremity 11/23/2015   Genital herpes 07/17/2011   Neoplasm of skin of lower leg 07/17/2011    PCP: Benard Rink  REFERRING PROVIDER: Eldred Manges, MD  REFERRING DIAG: M17.11 (ICD-10-CM) - Unilateral primary osteoarthritis, right  knee  THERAPY DIAG:  Chronic pain of right knee  Muscle weakness (generalized)  Difficulty in walking, not elsewhere classified  Localized edema  Rationale for Evaluation and Treatment: Rehabilitation  ONSET DATE: "for months" but worsened in Oct 2023  SUBJECTIVE:   SUBJECTIVE STATEMENT: Feeling better.  He mentioned standing and range of motion has been better.  He indicated he has still reduced stairs that he had done.   PERTINENT HISTORY: Right knee OA medial compartment  PAIN:  NPRS scale: at worst since last visit 3/10.  Pain location: Rt knee Pain description: stiffness, ache Aggravating factors: stairs, basketball, prolonged standing/walking Relieving factors: icing at times, knee brace at times  PRECAUTIONS: None  WEIGHT BEARING RESTRICTIONS: No  FALLS:  Has patient fallen in last 6 months? No  LIVING ENVIRONMENT: Lives in: House/apartment Stairs: flight of stairs to bedroom  OCCUPATION: Works as principal with standing/walking  PLOF: Independent, recreational basketball(1x/week), cardio machines at times  PATIENT GOALS: Reduce pain, swelling   OBJECTIVE:   PATIENT SURVEYS:  10/30/2022 FOTO intake: 54   predicted:  71  COGNITION: 10/30/2022 Overall cognitive status: WFL    SENSATION: 10/30/2022 Unremarkable  EDEMA:  10/30/2022 Visual edema noted Rt knee compared to lt.   MUSCLE LENGTH: 10/30/2022 No specific testing today  POSTURE:  10/30/2022 Unremarkable to condition  PALPATION: 10/30/2022 Mild tenderness joint line Rt knee anteriorly   LOWER EXTREMITY  ROM:   ROM Right 10/30/2022 Left 10/30/2022  Hip flexion    Hip extension    Hip abduction    Hip adduction    Hip internal rotation    Hip external rotation    Knee flexion 125 with pain in supine heel slide AROM 130 with pain in supine heel slide AROM  Knee extension 0 0  Ankle dorsiflexion    Ankle plantarflexion    Ankle inversion    Ankle eversion     (Blank rows = not  tested)  LOWER EXTREMITY MMT:  MMT Right 10/30/2022 Measured seated Left 10/30/2022 Measured seated Right 11/09/2022 Left 11/09/2022  Hip flexion 5/5 5/5    Hip extension      Hip abduction      Hip adduction      Hip internal rotation      Hip external rotation      Knee flexion 5/5 5/5    Knee extension 5/5 62.4, 68 lbs With pain 5/5 70, 70.3 lbs 5/5 67, 68 lbs With no pain 5/5 84.8, 82.6 lbs  Ankle dorsiflexion      Ankle plantarflexion      Ankle inversion      Ankle eversion       (Blank rows = not tested)  LOWER EXTREMITY SPECIAL TESTS:  10/30/2022 No specific testing  FUNCTIONAL TESTS:  10/30/2022 18 inch chair transfer: 18 inch chair s UE with mild soreness with movement Lt SLS: 30 seconds unremarkable Rt SLS: 30 seconds c increased aberrant movement, mild symptoms c duration  GAIT: 10/30/2022 Independent ambulation c mild deviation in stance phase on Rt vs. Lt.    TODAY'S TREATMENT                                                                          DATE:11/09/2022 Therex: Recumbent bike lvl 3 8 mins  Leg press double leg 125 lbs x 15, single leg 62 lbs 2 x 15 bilateral Knee extension double leg up, single leg lowering 2 x 10 bilaterally 15 lbs  Star excursion reaching light touch fwd/lateral/back x 8 each bilateral Lateral step down control 4 inch 2 x 15 WB on Rt leg Step on over and down 4 inch WB on Rt leg x 16  Review of additions for home program.     TODAY'S TREATMENT                                                                          DATE:10/30/2022 Therex:    HEP instruction/performance c cues for techniques, handout provided.  Trial set performed of each for comprehension and symptom assessment.  See below for exercise list  PATIENT EDUCATION:  11/09/2022 Education details: HEP update Person educated: Patient Education method: Explanation, Demonstration, Verbal cues, and Handouts Education comprehension: verbalized understanding,  returned demonstration, and verbal cues required  HOME EXERCISE PROGRAM: Access Code: [email or web address] URL: [email or web address] Date: 11/09/2022 Prepared by: Chyrel Masson  Exercises -  Seated Long Arc Quad  - 3-5 x daily - 7 x weekly - 1-2 sets - 10 reps - 2 hold - Supine Heel Slide  - 3-5 x daily - 7 x weekly - 1-2 sets - 10 reps - 2 hold - Seated Quad Set  - 3-5 x daily - 7 x weekly - 1 sets - 10 reps - 5 hold - Seated Straight Leg Heel Taps  - 1-2 x daily - 7 x weekly - 3 sets - 10 reps - Star Excursion Combo Reaches  - 1-2 x daily - 7 x weekly - 1 sets - 10 reps - Lateral Step Down (Mirrored)  - 1 x daily - 7 x weekly - 2-3 sets - 10-15 reps  ASSESSMENT:  CLINICAL IMPRESSION: Good initial response of symptom reductions indicated since first visit.  Reduced pain c knee extension testing but similar strength.  Pt to benefit from progression of strengthening program to address strength deficits.   OBJECTIVE IMPAIRMENTS: Abnormal gait, decreased activity tolerance, decreased balance, decreased coordination, decreased endurance, decreased mobility, difficulty walking, decreased ROM, decreased strength, increased edema, impaired perceived functional ability, impaired flexibility, improper body mechanics, and pain.   ACTIVITY LIMITATIONS: lifting, bending, sitting, standing, squatting, stairs, transfers, and locomotion level  PARTICIPATION LIMITATIONS: interpersonal relationship, community activity, occupation, and exercise  PERSONAL FACTORS: No specific factors are also affecting patient's functional outcome.   REHAB POTENTIAL: Good  CLINICAL DECISION MAKING: Stable/uncomplicated  EVALUATION COMPLEXITY: Low   GOALS: Goals reviewed with patient? Yes  SHORT TERM GOALS: (target date for Short term goals are 3 weeks 11/20/2022)   1.  Patient will demonstrate independent use of home exercise program to maintain progress from in clinic treatments.  Goal status: on going  11/09/2022  LONG TERM GOALS: (target dates for all long term goals are 10 weeks  01/08/2023 )   1. Patient will demonstrate/report pain at worst less than or equal to 2/10 to facilitate minimal limitation in daily activity secondary to pain symptoms.  Goal status: New   2. Patient will demonstrate independent use of home exercise program to facilitate ability to maintain/progress functional gains from skilled physical therapy services.  Goal status: New   3. Patient will demonstrate FOTO outcome > or = 71 % to indicate reduced disability due to condition.  Goal status: New   4.  Patient will demonstrate Rt knee extension dynamometry improvement > = 10 lbs to faciltiate usual transfers, stairs, squatting at PLOF for daily life.   Goal status: New   5.  Patient will demonstrate ascending/descending stairs reciprocal without UE assist or deviation for household and school navigation. Goal status: New   6.  Patient will demonstrate bilateral SLS 30 seconds s aberrant movement/symptoms for stability in activity.  Goal status: New    PLAN:  PT FREQUENCY: 1-2x/week  PT DURATION: 10 weeks  PLANNED INTERVENTIONS: Therapeutic exercises, Therapeutic activity, Neuro Muscular re-education, Balance training, Gait training, Patient/Family education, Joint mobilization, Stair training, DME instructions, Dry Needling, Electrical stimulation, Traction, Cryotherapy, vasopneumatic deviceMoist heat, Taping, Ultrasound, Ionotophoresis 4mg /ml Dexamethasone, and Manual therapy.  All included unless contraindicated  PLAN FOR NEXT SESSION:  Review status updates on strength and additions to HEP.    Chyrel Masson, PT, DPT, OCS, ATC 11/09/22  8:35 AM   PHYSICAL THERAPY DISCHARGE SUMMARY  Visits from Start of Care: 2  Current functional level related to goals / functional outcomes: See note   Remaining deficits: See note   Education / Equipment: HEP  Patient  goals were partially met. Patient  is being discharged due to not returning since the last visit.   Chyrel Masson, PT, DPT, OCS, ATC 12/28/22  10:24 AM

## 2022-11-15 ENCOUNTER — Telehealth: Payer: Self-pay | Admitting: Rehabilitative and Restorative Service Providers"

## 2022-11-15 ENCOUNTER — Encounter: Payer: BC Managed Care – PPO | Admitting: Rehabilitative and Restorative Service Providers"

## 2022-11-15 NOTE — Therapy (Incomplete)
OUTPATIENT PHYSICAL THERAPY TREATMENT   Patient Name: Adam Flowers MRN: [id number] DOB:August 10, 1981, 41 y.o., male Today's Date: 11/15/2022  END OF SESSION:     Past Medical History:  Diagnosis Date   Allergy    Genital herpes    Varicose vein    Wears glasses    Past Surgical History:  Procedure Laterality Date   ARTHROSCOPY KNEE W/ DRILLING Left    Patient Active Problem List   Diagnosis Date Noted   Unilateral primary osteoarthritis, right knee 10/22/2022   Synovitis of right knee 09/05/2022   Seasonal allergic rhinitis due to pollen 01/17/2021   Allergic conjunctivitis of both eyes 01/17/2021   Encounter for health maintenance examination in adult 08/26/2020   Vaccine counseling 08/26/2020   Varicose veins of lower extremity 08/26/2020   Pain and swelling of right knee 07/18/2020   Varicose veins of right lower extremity 07/18/2020   Influenza vaccination declined 07/04/2018   Elevated blood-pressure reading without diagnosis of hypertension 03/25/2018   Herpetic lesion 03/25/2018   Screening for lipid disorders 11/23/2015   Varicose veins of lower extremities with ulcer ([place]) 11/23/2015   Chronic venous insufficiency 11/23/2015   Venous stasis dermatitis of right lower extremity 11/23/2015   Genital herpes 07/17/2011   Neoplasm of skin of lower leg 07/17/2011    PCP: Jacklynn Barnacle  REFERRING PROVIDER: Marybelle Killings, MD  REFERRING DIAG: M17.11 (ICD-10-CM) - Unilateral primary osteoarthritis, right knee  THERAPY DIAG:  No diagnosis found.  Rationale for Evaluation and Treatment: Rehabilitation  ONSET DATE: "for months" but worsened in Oct 2023  SUBJECTIVE:   SUBJECTIVE STATEMENT: Feeling better.  He mentioned standing and range of motion has been better.  He indicated he has still reduced stairs that he had done.   PERTINENT HISTORY: Right knee OA medial compartment  PAIN:  NPRS scale: at worst since last visit 3/10.  Pain location: Rt  knee Pain description: stiffness, ache Aggravating factors: stairs, basketball, prolonged standing/walking Relieving factors: icing at times, knee brace at times  PRECAUTIONS: None  WEIGHT BEARING RESTRICTIONS: No  FALLS:  Has patient fallen in last 6 months? No  LIVING ENVIRONMENT: Lives in: House/apartment Stairs: flight of stairs to bedroom  OCCUPATION: Works as principal with standing/walking  PLOF: Independent, recreational basketball(1x/week), cardio machines at times  PATIENT GOALS: Reduce pain, swelling   OBJECTIVE:   PATIENT SURVEYS:  10/30/2022 FOTO intake: 54   predicted:  71  COGNITION: 10/30/2022 Overall cognitive status: WFL    SENSATION: 10/30/2022 Unremarkable  EDEMA:  10/30/2022 Visual edema noted Rt knee compared to lt.   MUSCLE LENGTH: 10/30/2022 No specific testing today  POSTURE:  10/30/2022 Unremarkable to condition  PALPATION: 10/30/2022 Mild tenderness joint line Rt knee anteriorly   LOWER EXTREMITY ROM:   ROM Right 10/30/2022 Left 10/30/2022  Hip flexion    Hip extension    Hip abduction    Hip adduction    Hip internal rotation    Hip external rotation    Knee flexion 125 with pain in supine heel slide AROM 130 with pain in supine heel slide AROM  Knee extension 0 0  Ankle dorsiflexion    Ankle plantarflexion    Ankle inversion    Ankle eversion     (Blank rows = not tested)  LOWER EXTREMITY MMT:  MMT Right 10/30/2022 Measured seated Left 10/30/2022 Measured seated Right 11/09/2022 Left 11/09/2022  Hip flexion 5/5 5/5    Hip extension      Hip abduction  Hip adduction      Hip internal rotation      Hip external rotation      Knee flexion 5/5 5/5    Knee extension 5/5 62.4, 68 lbs With pain 5/5 70, 70.3 lbs 5/5 67, 68 lbs With no pain 5/5 84.8, 82.6 lbs  Ankle dorsiflexion      Ankle plantarflexion      Ankle inversion      Ankle eversion       (Blank rows = not tested)  LOWER EXTREMITY SPECIAL  TESTS:  10/30/2022 No specific testing  FUNCTIONAL TESTS:  10/30/2022 18 inch chair transfer: 18 inch chair s UE with mild soreness with movement Lt SLS: 30 seconds unremarkable Rt SLS: 30 seconds c increased aberrant movement, mild symptoms c duration  GAIT: 10/30/2022 Independent ambulation c mild deviation in stance phase on Rt vs. Lt.    TODAY'S TREATMENT                                                                          DATE:11/15/2022 Therex: Recumbent bike lvl 3 8 mins  Leg press double leg 125 lbs x 15, single leg 62 lbs 2 x 15 bilateral Knee extension double leg up, single leg lowering 2 x 10 bilaterally 15 lbs  Star excursion reaching light touch fwd/lateral/back x 8 each bilateral Lateral step down control 4 inch 2 x 15 WB on Rt leg Step on over and down 4 inch WB on Rt leg x 16  TODAY'S TREATMENT                                                                          DATE:11/09/2022 Therex: Recumbent bike lvl 3 8 mins  Leg press double leg 125 lbs x 15, single leg 62 lbs 2 x 15 bilateral Knee extension double leg up, single leg lowering 2 x 10 bilaterally 15 lbs  Star excursion reaching light touch fwd/lateral/back x 8 each bilateral Lateral step down control 4 inch 2 x 15 WB on Rt leg Step on over and down 4 inch WB on Rt leg x 16  Review of additions for home program.    TODAY'S TREATMENT                                                                          DATE:10/30/2022 Therex:    HEP instruction/performance c cues for techniques, handout provided.  Trial set performed of each for comprehension and symptom assessment.  See below for exercise list  PATIENT EDUCATION:  11/09/2022 Education details: HEP update Person educated: Patient Education method: Explanation, Demonstration, Verbal cues, and Handouts Education comprehension: verbalized understanding, returned demonstration, and verbal cues  required  HOME EXERCISE PROGRAM: Access Code:  [email or web address] URL: [email or web address] Date: 11/09/2022 Prepared by: Scot Jun  Exercises - Seated Long Arc Quad  - 3-5 x daily - 7 x weekly - 1-2 sets - 10 reps - 2 hold - Supine Heel Slide  - 3-5 x daily - 7 x weekly - 1-2 sets - 10 reps - 2 hold - Seated Quad Set  - 3-5 x daily - 7 x weekly - 1 sets - 10 reps - 5 hold - Seated Straight Leg Heel Taps  - 1-2 x daily - 7 x weekly - 3 sets - 10 reps - Star Excursion Combo Reaches  - 1-2 x daily - 7 x weekly - 1 sets - 10 reps - Lateral Step Down (Mirrored)  - 1 x daily - 7 x weekly - 2-3 sets - 10-15 reps  ASSESSMENT:  CLINICAL IMPRESSION: Good initial response of symptom reductions indicated since first visit.  Reduced pain c knee extension testing but similar strength.  Pt to benefit from progression of strengthening program to address strength deficits.   OBJECTIVE IMPAIRMENTS: Abnormal gait, decreased activity tolerance, decreased balance, decreased coordination, decreased endurance, decreased mobility, difficulty walking, decreased ROM, decreased strength, increased edema, impaired perceived functional ability, impaired flexibility, improper body mechanics, and pain.   ACTIVITY LIMITATIONS: lifting, bending, sitting, standing, squatting, stairs, transfers, and locomotion level  PARTICIPATION LIMITATIONS: interpersonal relationship, community activity, occupation, and exercise  PERSONAL FACTORS: No specific factors are also affecting patient's functional outcome.   REHAB POTENTIAL: Good  CLINICAL DECISION MAKING: Stable/uncomplicated  EVALUATION COMPLEXITY: Low   GOALS: Goals reviewed with patient? Yes  SHORT TERM GOALS: (target date for Short term goals are 3 weeks 11/20/2022)   1.  Patient will demonstrate independent use of home exercise program to maintain progress from in clinic treatments.  Goal status: on going 11/09/2022  LONG TERM GOALS: (target dates for all long term goals are 10 weeks  01/08/2023  )   1. Patient will demonstrate/report pain at worst less than or equal to 2/10 to facilitate minimal limitation in daily activity secondary to pain symptoms.  Goal status: New   2. Patient will demonstrate independent use of home exercise program to facilitate ability to maintain/progress functional gains from skilled physical therapy services.  Goal status: New   3. Patient will demonstrate FOTO outcome > or = 71 % to indicate reduced disability due to condition.  Goal status: New   4.  Patient will demonstrate Rt knee extension dynamometry improvement > = 10 lbs to faciltiate usual transfers, stairs, squatting at PLOF for daily life.   Goal status: New   5.  Patient will demonstrate ascending/descending stairs reciprocal without UE assist or deviation for household and school navigation. Goal status: New   6.  Patient will demonstrate bilateral SLS 30 seconds s aberrant movement/symptoms for stability in activity.  Goal status: New    PLAN:  PT FREQUENCY: 1-2x/week  PT DURATION: 10 weeks  PLANNED INTERVENTIONS: Therapeutic exercises, Therapeutic activity, Neuro Muscular re-education, Balance training, Gait training, Patient/Family education, Joint mobilization, Stair training, DME instructions, Dry Needling, Electrical stimulation, Traction, Cryotherapy, vasopneumatic deviceMoist heat, Taping, Ultrasound, Ionotophoresis '4mg'$ /ml Dexamethasone, and Manual therapy.  All included unless contraindicated  PLAN FOR NEXT SESSION:  Review status updates on strength and additions to HEP.    Scot Jun, PT, DPT, OCS, ATC 11/15/22  7:47 AM

## 2022-11-15 NOTE — Telephone Encounter (Signed)
Indicated he had to stay at work.  He said he tried to call prior to 8 am but the phone line was not open yet.  Reminded him of next appointment time.   Scot Jun, PT, DPT, OCS, ATC 11/15/22  8:18 AM

## 2022-11-21 ENCOUNTER — Encounter: Payer: BC Managed Care – PPO | Admitting: Rehabilitative and Restorative Service Providers"

## 2022-11-21 ENCOUNTER — Telehealth: Payer: Self-pay | Admitting: Rehabilitative and Restorative Service Providers"

## 2022-11-21 NOTE — Telephone Encounter (Signed)
Called after 15 mins no show.  Pt indicated he was out of town but was doing better.  Requested cancelling next appointment and will call back if needed.  Scot Jun, PT, DPT, OCS, ATC 11/21/22  4:18 PM

## 2022-11-28 ENCOUNTER — Encounter: Payer: BC Managed Care – PPO | Admitting: Rehabilitative and Restorative Service Providers"

## 2022-12-07 ENCOUNTER — Ambulatory Visit: Payer: BC Managed Care – PPO | Admitting: Orthopaedic Surgery

## 2023-01-07 ENCOUNTER — Ambulatory Visit: Payer: BC Managed Care – PPO | Admitting: Medical

## 2023-01-07 VITALS — BP 124/70 | HR 92 | Wt 251.8 lb

## 2023-01-07 DIAGNOSIS — M25461 Effusion, right knee: Secondary | ICD-10-CM

## 2023-01-07 DIAGNOSIS — Z6836 Body mass index (BMI) 36.0-36.9, adult: Secondary | ICD-10-CM | POA: Diagnosis not present

## 2023-01-07 DIAGNOSIS — R7989 Other specified abnormal findings of blood chemistry: Secondary | ICD-10-CM | POA: Diagnosis not present

## 2023-01-07 DIAGNOSIS — M1711 Unilateral primary osteoarthritis, right knee: Secondary | ICD-10-CM | POA: Diagnosis not present

## 2023-01-07 MED ORDER — DICLOFENAC SODIUM 75 MG PO TBEC: 75.0000 mg | DELAYED_RELEASE_TABLET | Freq: Two times a day (BID) | ORAL | 1 refills | Status: DC

## 2023-01-07 MED ORDER — QSYMIA 7.5-46 MG PO CP24: 1.0000 | ORAL_CAPSULE | ORAL | 1 refills | Status: DC

## 2023-01-07 NOTE — Progress Notes (Signed)
Subjective:  Adam Flowers is a 42 y.o. male who presents for Chief Complaint  Patient presents with   right knee    Right knee pain, swollen and feels like fluid. Been like this since October and feels like its getting worse     Here for recheck on right knee.   Has been having ongoing swelling of the right knee, ongoing pain for months.  Had consult with ortho after our referral.    He notes seeing ortho specialist in  08/2022.  Had PT about 4 visits.  Was getting some improvements in pain but swelling hasn't resolved.  He is a Museum/gallery curator, and coaches as well.  So some days he is on the knee longer work days, feels worse.  Sometimes throbbing pain.  Today is worse swelling he has had.  Not taking anything for this.   Ortho deferred NSAID to use given prior elevated creatinine.  No hx/o gout.  No other joint pain or swelling . No other aggravating or relieving factors.    No other c/o.  The following portions of the patient's history were reviewed and updated as appropriate: allergies, current medications, past family history, past medical history, past social history, past surgical history and problem list.  ROS Otherwise as in subjective above    Objective: BP 124/70   Pulse 92   Wt 251 lb 12.8 oz (114.2 kg)   BMI 36.13 kg/m   Wt Readings from Last 3 Encounters:  01/07/23 251 lb 12.8 oz (114.2 kg)  10/22/22 240 lb (108.9 kg)  09/13/22 245 lb 6.4 oz (111.3 kg)    General appearance: alert, no distress, well developed, well nourished MSK: Right knee with moderate effusion, no erythema or warmth, mildly decreased ROM due to pain, no laxity. Rest of bilat legs unremarkable Pulses: 2+ radial pulses, 2+ pedal pulses, normal cap refill Ext: no edema Legs neurovascularly intact    MRI right knee 12/15/223 IMPRESSION: 1. Minimal free edge fraying of the lateral meniscus midbody. No discrete meniscal tear. 2. Mild tricompartmental osteoarthritis. 3. Small joint  effusion.   Right knee xray 08/13/22  IMPRESSION: Minor degenerative changes as above. No acute finding by plain radiography.      Assessment: Encounter Diagnoses  Name Primary?   Osteoarthritis of right knee, unspecified osteoarthritis type Yes   Knee effusion, right    Elevated serum creatinine    BMI 36.0-36.9,adult      Plan: We discussed symptoms, concerns, exam findings.  I reviewed his 08/2022 and 10/2022 ortho notes and MRI and xrays as above.  He had a steroid injection into the knee 09/04/2022 with Dr. Ophelia Charter.  Discussed case with supervising physician Dr. Susann Givens.  We will get him back in to work for further discussion, possible aspiration, possible gel treatment or other treatment.  He only seemed to have gotten 3 to 4 months of slight improvement with the prior steroid injection from December 2023 at Ortho.  We discussed the following recommendations  Patient Instructions  Knee arthritis and swelling Follow up with Dr. Ophelia Charter soon, and I will correspond with him today Begin Diclofenac anti-inflammatory twice daily for 7 - 10 days initially, then as needed Other options for less pain or swelling is topical creams such as Aspercreme, Capsaicin, or Voltaren gel topically This week particularly since it is worse swollen, use ice therapy 15-20 minutes preferably at twice daily, and elevated the knee when you are doing ice therapy Begin knee sleeve daily later this week for compression  When it is flared up, try to rest In general you can use over the counter Tylenol for pain.  Tylenol comes in , , and  strength.   Try to limit to no more than 2 tylenol per day if you are having to take this regularly   Begin Qsymia medication to  help with weight loss efforts.  Work on non impact exercise such as water aerobics, swimming or hand bike  Find places to cut 500 calories per day in the diet.      [name] was seen today for right knee.  Diagnoses and  all orders for this visit:  Osteoarthritis of right knee, unspecified osteoarthritis type  Knee effusion, right  Elevated serum creatinine  BMI 36.0-36.9,adult  Other orders -     diclofenac (VOLTAREN) 75 MG EC tablet; Take 1 tablet (75 mg total) by mouth 2 (two) times daily. -     Phentermine-Topiramate (QSYMIA) 7.5-46 MG CP24; Take 1 capsule by mouth every morning.    Follow up: with ortho

## 2023-01-07 NOTE — Patient Instructions (Addendum)
Knee arthritis and swelling Follow up with Dr. Ophelia Charter soon, and I will correspond with him today Begin Diclofenac anti-inflammatory twice daily for 7 - 10 days initially, then as needed Other options for less pain or swelling is topical creams such as Aspercreme, Capsaicin, or Voltaren gel topically This week particularly since it is worse swollen, use ice therapy 15-20 minutes preferably at twice daily, and elevated the knee when you are doing ice therapy Begin knee sleeve daily later this week for compression When it is flared up, try to rest In general you can use over the counter Tylenol for pain.  Tylenol comes in , , and  strength.   Try to limit to no more than 2 tylenol per day if you are having to take this regularly   Begin Qsymia medication to  help with weight loss efforts.  Work on non impact exercise such as water aerobics, swimming or hand bike  Find places to cut 500 calories per day in the diet.

## 2023-01-08 ENCOUNTER — Telehealth: Payer: Self-pay | Admitting: Radiology

## 2023-01-08 NOTE — Telephone Encounter (Signed)
I called patient to schedule appointment for his right knee per Dr. Ophelia Charter. Patient is at work this morning and will have to call back to schedule. Please send message to me if there is not an appointment time available to get patient in quickly.

## 2023-01-23 ENCOUNTER — Telehealth: Payer: Self-pay | Admitting: Medical

## 2023-01-23 NOTE — Telephone Encounter (Signed)
Pt needs a PA for Qsymia

## 2023-01-30 ENCOUNTER — Telehealth: Payer: Self-pay | Admitting: Medical

## 2023-01-30 NOTE — Telephone Encounter (Signed)
PA Case ID #: [id number] Status sent Drug Qsymia 7.5-46MG  er capsules

## 2023-01-31 ENCOUNTER — Other Ambulatory Visit: Payer: Self-pay | Admitting: Medical

## 2023-02-06 NOTE — Telephone Encounter (Signed)
PA Case ID #: [id number] Status sent Drug Qsymia 7.5-46MG er capsules 

## 2023-02-08 NOTE — Telephone Encounter (Signed)
P.A. QSYMIA denied, no weight loss meds are covered.

## 2023-02-26 NOTE — Telephone Encounter (Signed)
Left message for pt, gave info about cash pay program for Qsymia $98 a month

## 2023-09-16 ENCOUNTER — Encounter: Payer: BC Managed Care – PPO | Admitting: Medical

## 2023-09-16 ENCOUNTER — Telehealth: Payer: Self-pay | Admitting: Medical

## 2023-09-16 NOTE — Telephone Encounter (Signed)
This patient no showed for their appointment today.Which of the following is necessary for this patient.   A) No follow-up necessary   B) Follow-up urgent. Locate Patient Immediately.   C) Follow-up necessary. Contact patient and Schedule visit in ____ Days.   D) Follow-up Advised. Contact patient and Schedule visit in ____ Days.   E) Please Send no show letter to patient. Charge no show fee if no show was a CPE.   

## 2023-10-09 ENCOUNTER — Encounter: Payer: BC Managed Care – PPO | Admitting: Medical

## 2023-10-18 ENCOUNTER — Telehealth: Payer: Self-pay | Admitting: Internal Medicine

## 2023-10-18 ENCOUNTER — Encounter: Payer: BC Managed Care – PPO | Admitting: Medical

## 2023-10-18 DIAGNOSIS — Z0000 Encounter for general adult medical examination without abnormal findings: Secondary | ICD-10-CM

## 2023-10-18 NOTE — Telephone Encounter (Signed)
This patient no showed for their appointment today. Patient has NO SHOWED once and canceled TWICE SAME DAY for CPE. He is now rescheduled to 11/14/23  Which of the following is necessary for this patient.   A) No follow-up necessary   B) Follow-up urgent. Locate Patient Immediately.   C) Follow-up necessary. Contact patient and Schedule visit in ____ Days.   D) Follow-up Advised. Contact patient and Schedule visit in ____ Days.  E) Please Send no show letter to patient. Charge no show fee if no show was a CPE.

## 2023-11-14 ENCOUNTER — Ambulatory Visit: Payer: BC Managed Care – PPO | Admitting: Medical

## 2023-11-14 VITALS — BP 110/72 | HR 88 | Ht 69.0 in | Wt 238.0 lb

## 2023-11-14 DIAGNOSIS — Z13220 Encounter for screening for lipoid disorders: Secondary | ICD-10-CM | POA: Diagnosis not present

## 2023-11-14 DIAGNOSIS — Z136 Encounter for screening for cardiovascular disorders: Secondary | ICD-10-CM

## 2023-11-14 DIAGNOSIS — Z1389 Encounter for screening for other disorder: Secondary | ICD-10-CM

## 2023-11-14 DIAGNOSIS — Z0000 Encounter for general adult medical examination without abnormal findings: Secondary | ICD-10-CM

## 2023-11-14 DIAGNOSIS — R41840 Attention and concentration deficit: Secondary | ICD-10-CM

## 2023-11-14 DIAGNOSIS — Z131 Encounter for screening for diabetes mellitus: Secondary | ICD-10-CM

## 2023-11-14 LAB — POCT URINALYSIS DIP (PROADVANTAGE DEVICE)
Bilirubin, UA: NEGATIVE
Blood, UA: NEGATIVE
Glucose, UA: NEGATIVE mg/dL
Ketones, POC UA: NEGATIVE mg/dL
Leukocytes, UA: NEGATIVE
Nitrite, UA: NEGATIVE
Protein Ur, POC: NEGATIVE mg/dL
Specific Gravity, Urine: 1.01
Urobilinogen, Ur: NEGATIVE
pH, UA: 6 (ref 5.0–8.0)

## 2023-11-14 LAB — LIPID PANEL

## 2023-11-14 NOTE — Progress Notes (Unsigned)
 Complete physical exam  Patient: Adam Flowers   DOB: [birth year]   43 y.o. Male  MRN: [id number]  Subjective:    Chief Complaint  Patient presents with  . Annual Exam    Fasting cpe, would like A1c checked, no concerns    Adam Flowers is a 43 y.o. male who presents today for a complete physical exam.   Doing well but sometimes has some trouble with focus.   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 0  Number falls in past yr: 0  Injury with Fall? 0  Risk for fall due to : No Fall Risks  Follow up Falls evaluation completed     Most recent depression screenings:    11/14/2023    2:00 PM 01/07/2023    3:27 PM  PHQ 2/9 Scores  PHQ - 2 Score 0 0    Vision:Within last year and Dental: No current dental problems  Patient Active Problem List   Diagnosis Date Noted  . Unilateral primary osteoarthritis, right knee 10/22/2022  . Synovitis of right knee 09/05/2022  . Seasonal allergic rhinitis due to pollen 01/17/2021  . Allergic conjunctivitis of both eyes 01/17/2021  . Encounter for health maintenance examination in adult 08/26/2020  . Vaccine counseling 08/26/2020  . Varicose veins of lower extremity 08/26/2020  . Pain and swelling of right knee 07/18/2020  . Varicose veins of right lower extremity 07/18/2020  . Influenza vaccination declined 07/04/2018  . Elevated blood-pressure reading without diagnosis of hypertension 03/25/2018  . Herpetic lesion 03/25/2018  . Screening for lipid disorders 11/23/2015  . Varicose veins of lower extremities with ulcer (HCC) 11/23/2015  . Chronic venous insufficiency 11/23/2015  . Venous stasis dermatitis of right lower extremity 11/23/2015  . Genital herpes 07/17/2011  . Neoplasm of skin of lower leg 07/17/2011   Past Medical History:  Diagnosis Date  . Allergy   . Genital  herpes   . Varicose vein   . Wears glasses    Past Surgical History:  Procedure Laterality Date  . ARTHROSCOPY KNEE W/ DRILLING Left    Social History   Tobacco Use  . Smoking status: Never  . Smokeless tobacco: Never  Substance Use Topics  . Alcohol use: Yes    Comment: once monthly, minimal  . Drug use: Not Currently    Types: Marijuana   Family History  Problem Relation Age of Onset  . Kidney disease Mother   . Diabetes Father   . Cancer Neg Hx   . Heart disease Neg Hx   . Hypertension Neg Hx   . Hyperlipidemia Neg Hx   . Stroke Neg Hx    No Known Allergies    Patient Care Team: Alaija Ruble, Cleda Mccreedy as PCP - General   Outpatient Medications Prior to Visit  Medication Sig  . [DISCONTINUED] diclofenac (VOLTAREN) 75 MG EC tablet TAKE 1 TABLET(75 MG) BY MOUTH TWICE DAILY (Patient not taking: Reported on 11/14/2023)  . [DISCONTINUED] Phentermine-Topiramate (QSYMIA) 7.5-46 MG CP24 Take 1 capsule by mouth every morning. (Patient not taking: Reported on 11/14/2023)   No facility-administered medications prior to visit.    Review of Systems  Constitutional:  Positive for malaise/fatigue. Negative for chills, fever and weight loss.  HENT:  Negative for congestion, ear pain, hearing loss, sore throat  and tinnitus.   Eyes:  Negative for blurred vision, pain and redness.  Respiratory:  Negative for cough, hemoptysis and shortness of breath.   Cardiovascular:  Negative for chest pain, palpitations, orthopnea, claudication and leg swelling.  Gastrointestinal:  Negative for abdominal pain, blood in stool, constipation, diarrhea, nausea and vomiting.  Genitourinary:  Negative for dysuria, flank pain, frequency, hematuria and urgency.  Musculoskeletal:  Negative for falls, joint pain and myalgias.  Skin:  Negative for itching and rash.  Neurological:  Negative for dizziness, tingling, speech change, weakness and headaches.       Snoring  Endo/Heme/Allergies:  Negative for  polydipsia. Does not bruise/bleed easily.  Psychiatric/Behavioral:  Negative for depression and memory loss. The patient is not nervous/anxious and does not have insomnia.        Focus and attention        Objective:     BP 110/72   Pulse 88   Ht 5\' 9"  (1.753 m)   Wt 238 lb (108 kg)   BMI 35.15 kg/m  BP Readings from Last 3 Encounters:  11/14/23 110/72  01/07/23 124/70  10/22/22 124/86   Wt Readings from Last 3 Encounters:  11/14/23 238 lb (108 kg)  01/07/23 251 lb 12.8 oz (114.2 kg)  10/22/22 240 lb (108.9 kg)      Physical Exam Vitals and nursing note reviewed.  Constitutional:      General: He is not in acute distress.    Appearance: Normal appearance. He is not ill-appearing.  HENT:     Head: Normocephalic and atraumatic.     Right Ear: External ear normal.     Left Ear: External ear normal.     Nose: Nose normal.     Mouth/Throat:     Mouth: Mucous membranes are moist.     Pharynx: Oropharynx is clear.  Eyes:     Extraocular Movements: Extraocular movements intact.     Conjunctiva/sclera: Conjunctivae normal.     Pupils: Pupils are equal, round, and reactive to light.  Neck:     Vascular: No carotid bruit.  Cardiovascular:     Rate and Rhythm: Normal rate and regular rhythm.     Pulses: Normal pulses.     Heart sounds: Normal heart sounds.  Pulmonary:     Effort: Pulmonary effort is normal.     Breath sounds: Normal breath sounds.  Abdominal:     General: Bowel sounds are normal. There is no distension.     Palpations: Abdomen is soft. There is no mass.     Tenderness: There is no abdominal tenderness.     Hernia: No hernia is present.  Genitourinary:    Penis: Normal.      Testes: Normal.  Musculoskeletal:        General: No swelling, tenderness or deformity. Normal range of motion.     Cervical back: Normal range of motion and neck supple. No tenderness.     Right lower leg: No edema.     Left lower leg: No edema.  Lymphadenopathy:      Cervical: No cervical adenopathy.  Skin:    General: Skin is warm and dry.     Capillary Refill: Capillary refill takes less than 2 seconds.  Neurological:     General: No focal deficit present.     Mental Status: He is alert and oriented to person, place, and time. Mental status is at baseline.     Cranial Nerves: No cranial nerve deficit.  Sensory: No sensory deficit.     Motor: No weakness.     Gait: Gait normal.     Deep Tendon Reflexes: Reflexes normal.  Psychiatric:        Mood and Affect: Mood normal.        Behavior: Behavior normal.        Judgment: Judgment normal.         Assessment & Plan:    Routine Health Maintenance and Physical Exam  Immunization History  Administered Date(s) Administered  . Influenza Split 06/11/2012  . Moderna SARS-COV2 Booster Vaccination 08/30/2020  . Moderna Sars-Covid-2 Vaccination 11/23/2019, 12/21/2019  . PPD Test 06/03/2012  . Tdap 06/11/2012, 09/13/2022    Health Maintenance  Topic Date Due  . COVID-19 Vaccine (3 - Moderna risk series) 11/30/2023 (Originally 09/27/2020)  . INFLUENZA VACCINE  12/16/2023 (Originally 04/18/2023)  . DTaP/Tdap/Td (3 - Td or Tdap) 09/13/2032  . HPV VACCINES  Aged Out  . Hepatitis C Screening  Discontinued  . HIV Screening  Discontinued    Discussed health benefits of physical activity, and encouraged him to engage in regular exercise appropriate for his age and condition.   Problem List Items Addressed This Visit     Encounter for health maintenance examination in adult - Primary   Relevant Orders   Comprehensive metabolic panel (Completed)   CBC (Completed)   Lipid panel (Completed)   Hemoglobin A1c (Completed)   TSH (Completed)   POCT Urinalysis DIP (Proadvantage Device) (Completed)   Testosterone (Completed)   Other Visit Diagnoses       Encounter for lipid screening for cardiovascular disease       Relevant Orders   Lipid panel (Completed)     Screening for hematuria or  proteinuria       Relevant Orders   Comprehensive metabolic panel (Completed)   POCT Urinalysis DIP (Proadvantage Device) (Completed)     Attention deficit       Relevant Orders   TSH (Completed)   Testosterone (Completed)     Screening for diabetes mellitus       Relevant Orders   Hemoglobin A1c (Completed)      Return for CPX physical fasting.     Kristian Covey, PA-C

## 2023-11-15 LAB — COMPREHENSIVE METABOLIC PANEL
ALT: 23 IU/L (ref 0–44)
AST: 16 IU/L (ref 0–40)
Albumin: 5.1 g/dL (ref 4.1–5.1)
Alkaline Phosphatase: 70 IU/L (ref 44–121)
BUN/Creatinine Ratio: 10 (ref 9–20)
BUN: 13 mg/dL (ref 6–24)
Bilirubin Total: 0.6 mg/dL (ref 0.0–1.2)
CO2: 24 mmol/L (ref 20–29)
Calcium: 10 mg/dL (ref 8.7–10.2)
Chloride: 99 mmol/L (ref 96–106)
Creatinine, Ser: 1.28 mg/dL — ABNORMAL HIGH (ref 0.76–1.27)
Globulin, Total: 3 g/dL (ref 1.5–4.5)
Glucose: 75 mg/dL (ref 70–99)
Potassium: 4.4 mmol/L (ref 3.5–5.2)
Sodium: 139 mmol/L (ref 134–144)
Total Protein: 8.1 g/dL (ref 6.0–8.5)
eGFR: 72 mL/min/{1.73_m2} (ref >59)

## 2023-11-15 LAB — HEMOGLOBIN A1C
Est. average glucose Bld gHb Est-mCnc: 123 mg/dL
Hgb A1c MFr Bld: 5.9 % — ABNORMAL HIGH (ref 4.8–5.6)

## 2023-11-15 LAB — LIPID PANEL
Chol/HDL Ratio: 2.7 ratio (ref 0.0–5.0)
Cholesterol, Total: 156 mg/dL (ref 100–199)
HDL: 57 mg/dL
LDL Chol Calc (NIH): 89 mg/dL (ref 0–99)
Triglycerides: 44 mg/dL (ref 0–149)
VLDL Cholesterol Cal: 10 mg/dL (ref 5–40)

## 2023-11-15 LAB — CBC
Hematocrit: 44.9 % (ref 37.5–51.0)
Hemoglobin: 15.4 g/dL (ref 13.0–17.7)
MCH: 28.4 pg (ref 26.6–33.0)
MCHC: 34.3 g/dL (ref 31.5–35.7)
MCV: 83 fL (ref 79–97)
Platelets: 248 10*3/uL (ref 150–450)
RBC: 5.42 x10E6/uL (ref 4.14–5.80)
RDW: 13.5 % (ref 11.6–15.4)
WBC: 5.1 10*3/uL (ref 3.4–10.8)

## 2023-11-15 LAB — TSH: TSH: 2.08 u[IU]/mL (ref 0.450–4.500)

## 2023-11-15 LAB — TESTOSTERONE: Testosterone: 344 ng/dL (ref 264–916)

## 2023-11-15 NOTE — Progress Notes (Signed)
 Results sent through MyChart

## 2024-02-17 ENCOUNTER — Ambulatory Visit: Payer: Self-pay

## 2024-02-17 NOTE — Telephone Encounter (Signed)
 Copied from CRM [id number]. Topic: Clinical - Red Word Triage >> Feb 17, 2024 11:58 AM [id number] D wrote: Blood pressure has been high past few days. Reading today  135/ 101, yesterday 141/88 . Get's dizzy if standing to long.    Chief Complaint: Elevated BP with dizziness, comes and goes Symptoms: above Frequency: weekend Pertinent Negatives: Patient denies chest pain Disposition: [] ED /[] Urgent Care (no appt availability in office) / [x] Appointment(In office/virtual)/ []  [facility]/ [] Home Care/ [] Refused Recommended Disposition /[] [facility]/ []  Follow-up with PCP Additional Notes: agrees with appointment.  Reason for Disposition  [1] Systolic BP  >= 130 OR Diastolic >= 80 AND [2] not taking BP medications  Answer Assessment - Initial Assessment Questions 1. BLOOD PRESSURE: "What is the blood pressure?" "Did you take at least two measurements 5 minutes apart?"     135/101  141/88 2. ONSET: "When did you take your blood pressure?"     weekend 3. HOW: "How did you take your blood pressure?" (e.g., automatic home BP monitor, visiting nurse)     Home cuff 4. HISTORY: "Do you have a history of high blood pressure?"     no 5. MEDICINES: "Are you taking any medicines for blood pressure?" "Have you missed any doses recently?"     no 6. OTHER SYMPTOMS: "Do you have any symptoms?" (e.g., blurred vision, chest pain, difficulty breathing, headache, weakness)     Dizzy 7. PREGNANCY: "Is there any chance you are pregnant?" "When was your last menstrual period?"     N/a  Protocols used: Blood Pressure - High-A-AH

## 2024-02-19 ENCOUNTER — Encounter: Payer: Self-pay | Admitting: Medical

## 2024-02-19 ENCOUNTER — Ambulatory Visit: Admitting: Medical

## 2024-02-19 VITALS — BP 114/76 | HR 62 | Wt 248.0 lb

## 2024-02-19 DIAGNOSIS — R030 Elevated blood-pressure reading, without diagnosis of hypertension: Secondary | ICD-10-CM

## 2024-02-19 DIAGNOSIS — R42 Dizziness and giddiness: Secondary | ICD-10-CM | POA: Diagnosis not present

## 2024-02-19 NOTE — Progress Notes (Signed)
 Subjective:  Herny Flowers is a 43 y.o. male who presents for Chief Complaint  Patient presents with   Acute Visit    High BP since the weekend. 145/ something, 135/101. Has felt off the last 2 weeks so that's when he decided to start checking     Here for concerns.  Has been in the normal state of health until this past week.  He was almost stage at graduation felt a little lightheaded.  When he came off the stage a coworker took his blood pressure and it was elevated around 145 systolic.  Later that day he felt fine but the next day he felt little lightheaded as well.  He has checked his blood pressure a few times since then and has had a few elevated readings around 130 systolic  He denies chest pain, palpitations, shortness of breath, edema.  He has been exercising all that regularly.  No particular diet discretion.  He wonders if it was just a stress of the day with graduation ceremony  No other aggravating or relieving factors.    No other c/o.  Past Medical History:  Diagnosis Date   Allergy    Genital herpes    Varicose vein    Wears glasses    No current outpatient medications on file prior to visit.   No current facility-administered medications on file prior to visit.     The following portions of the patient's history were reviewed and updated as appropriate: allergies, current medications, past family history, past medical history, past social history, past surgical history and problem list.  ROS Otherwise as in subjective above     Objective: BP 114/76 (Cuff Size: Large)   Pulse 62   Wt 248 lb (112.5 kg)   SpO2 99%   BMI 36.62 kg/m   BP Readings from Last 3 Encounters:  02/19/24 114/76  11/14/23 110/72  01/07/23 124/70   Wt Readings from Last 3 Encounters:  02/19/24 248 lb (112.5 kg)  11/14/23 238 lb (108 kg)  01/07/23 251 lb 12.8 oz (114.2 kg)   My personal reading today was 114/76 left arm and we got other normal readings today in the  office  General appearance: alert, no distress, well developed, well nourished Neck: supple, no lymphadenopathy, no thyromegaly, no masses, no JVD or bruits Heart: RRR, normal S1, S2, no murmurs Lungs: CTA bilaterally, no wheezes, rhonchi, or rales Pulses: 2+ radial pulses, 2+ pedal pulses, normal cap refill Ext: no edema  EKG reviewed, new RSR' findings suggesting right conduction delay, otherwise normal   Assessment: Encounter Diagnoses  Name Primary?   Lightheaded Yes   Elevated blood pressure reading without diagnosis of hypertension      Plan: We discussed his concern and recent episode of lightheadedness and elevated blood pressure.  His blood pressure is actually normal today and he usually runs normal in his prior visits here.  He had a few elevated readings in the 130 systolic a few years ago but in the last year or 2 his numbers have been normal  We discussed the importance of hydration, avoiding excess caffeine and salt intake and processed food intake.  We discussed his recent physical in February and his labs are all normal except diabetes marker at risk for borderline for diabetes.  Discussed monitoring BP a few days per week.  If not resolving or if ongoing symptoms in the next few weeks, consider Zio 7 day monitor, otherwise eval, recheck BMET    [name] was seen  today for acute visit.  Diagnoses and all orders for this visit:  Lightheaded  Elevated blood pressure reading without diagnosis of hypertension    Follow up: prn

## 2024-02-21 NOTE — Addendum Note (Signed)
 Addended by: Claudene Crystal on: 02/21/2024 01:42 PM   Modules accepted: Orders

## 2024-09-29 ENCOUNTER — Ambulatory Visit: Admitting: Medical

## 2024-10-09 ENCOUNTER — Ambulatory Visit: Admitting: Medical

## 2024-11-25 ENCOUNTER — Encounter: Payer: BC Managed Care – PPO | Admitting: Medical
